# Patient Record
Sex: Female | Born: 1970 | Race: Black or African American | Hispanic: No | Smoking: Former smoker
Health system: Southern US, Community
[De-identification: ages and names within clinical notes are randomized; demographics above are authoritative.]

## PROBLEM LIST (undated history)

## (undated) DIAGNOSIS — C801 Malignant (primary) neoplasm, unspecified: Secondary | ICD-10-CM

## (undated) DIAGNOSIS — R911 Solitary pulmonary nodule: Secondary | ICD-10-CM

## (undated) HISTORY — PX: TUBAL LIGATION: SHX77

## (undated) HISTORY — PX: ABDOMINAL HYSTERECTOMY: SHX81

## (undated) HISTORY — DX: Solitary pulmonary nodule: R91.1

## (undated) HISTORY — PX: OTHER SURGICAL HISTORY: SHX169

---

## 2004-02-28 ENCOUNTER — Emergency Department (HOSPITAL_COMMUNITY): Admission: EM | Admit: 2004-02-28 | Discharge: 2004-02-28 | Payer: Self-pay | Admitting: Emergency Medicine

## 2004-12-19 ENCOUNTER — Emergency Department (HOSPITAL_COMMUNITY): Admission: EM | Admit: 2004-12-19 | Discharge: 2004-12-19 | Payer: Self-pay | Admitting: Emergency Medicine

## 2004-12-20 ENCOUNTER — Emergency Department (HOSPITAL_COMMUNITY): Admission: EM | Admit: 2004-12-20 | Discharge: 2004-12-20 | Payer: Self-pay | Admitting: Emergency Medicine

## 2005-01-26 ENCOUNTER — Emergency Department (HOSPITAL_COMMUNITY): Admission: EM | Admit: 2005-01-26 | Discharge: 2005-01-26 | Payer: Self-pay | Admitting: Family Medicine

## 2005-03-14 ENCOUNTER — Emergency Department (HOSPITAL_COMMUNITY): Admission: EM | Admit: 2005-03-14 | Discharge: 2005-03-14 | Payer: Self-pay | Admitting: Emergency Medicine

## 2005-04-26 ENCOUNTER — Emergency Department (HOSPITAL_COMMUNITY): Admission: EM | Admit: 2005-04-26 | Discharge: 2005-04-26 | Payer: Self-pay | Admitting: Emergency Medicine

## 2005-05-17 ENCOUNTER — Emergency Department (HOSPITAL_COMMUNITY): Admission: EM | Admit: 2005-05-17 | Discharge: 2005-05-17 | Payer: Self-pay | Admitting: Family Medicine

## 2005-11-24 ENCOUNTER — Emergency Department (HOSPITAL_COMMUNITY): Admission: EM | Admit: 2005-11-24 | Discharge: 2005-11-25 | Payer: Self-pay | Admitting: Emergency Medicine

## 2006-02-23 ENCOUNTER — Emergency Department (HOSPITAL_COMMUNITY): Admission: EM | Admit: 2006-02-23 | Discharge: 2006-02-23 | Payer: Self-pay | Admitting: Emergency Medicine

## 2006-04-25 ENCOUNTER — Emergency Department (HOSPITAL_COMMUNITY): Admission: EM | Admit: 2006-04-25 | Discharge: 2006-04-25 | Payer: Self-pay | Admitting: Emergency Medicine

## 2006-05-10 ENCOUNTER — Emergency Department (HOSPITAL_COMMUNITY): Admission: EM | Admit: 2006-05-10 | Discharge: 2006-05-10 | Payer: Self-pay | Admitting: Emergency Medicine

## 2006-08-30 ENCOUNTER — Emergency Department (HOSPITAL_COMMUNITY): Admission: EM | Admit: 2006-08-30 | Discharge: 2006-08-30 | Payer: Self-pay | Admitting: Emergency Medicine

## 2006-12-06 ENCOUNTER — Emergency Department (HOSPITAL_COMMUNITY): Admission: EM | Admit: 2006-12-06 | Discharge: 2006-12-06 | Payer: Self-pay | Admitting: Emergency Medicine

## 2007-05-02 ENCOUNTER — Emergency Department (HOSPITAL_COMMUNITY): Admission: EM | Admit: 2007-05-02 | Discharge: 2007-05-02 | Payer: Self-pay | Admitting: Emergency Medicine

## 2008-06-22 ENCOUNTER — Emergency Department (HOSPITAL_COMMUNITY): Admission: EM | Admit: 2008-06-22 | Discharge: 2008-06-22 | Payer: Self-pay | Admitting: Emergency Medicine

## 2009-06-06 ENCOUNTER — Emergency Department (HOSPITAL_COMMUNITY): Admission: EM | Admit: 2009-06-06 | Discharge: 2009-06-06 | Payer: Self-pay | Admitting: Emergency Medicine

## 2009-07-13 ENCOUNTER — Emergency Department (HOSPITAL_COMMUNITY): Admission: EM | Admit: 2009-07-13 | Discharge: 2009-07-13 | Payer: Self-pay | Admitting: Emergency Medicine

## 2009-08-22 ENCOUNTER — Encounter: Admission: RE | Admit: 2009-08-22 | Discharge: 2009-10-19 | Payer: Self-pay | Admitting: Orthopedic Surgery

## 2010-06-22 ENCOUNTER — Emergency Department (HOSPITAL_COMMUNITY)
Admission: EM | Admit: 2010-06-22 | Discharge: 2010-06-22 | Disposition: A | Discharge status: Home / Self Care | Payer: Self-pay | Attending: Emergency Medicine | Admitting: Emergency Medicine

## 2010-06-22 DIAGNOSIS — K5289 Other specified noninfective gastroenteritis and colitis: Secondary | ICD-10-CM | POA: Insufficient documentation

## 2010-06-22 DIAGNOSIS — R109 Unspecified abdominal pain: Secondary | ICD-10-CM | POA: Insufficient documentation

## 2010-06-22 DIAGNOSIS — K625 Hemorrhage of anus and rectum: Secondary | ICD-10-CM | POA: Insufficient documentation

## 2010-06-22 DIAGNOSIS — J45909 Unspecified asthma, uncomplicated: Secondary | ICD-10-CM | POA: Insufficient documentation

## 2010-06-22 LAB — URINALYSIS, ROUTINE W REFLEX MICROSCOPIC
Bilirubin Urine: NEGATIVE
Hgb urine dipstick: NEGATIVE
Ketones, ur: NEGATIVE mg/dL
Nitrite: NEGATIVE
Protein, ur: NEGATIVE mg/dL
Specific Gravity, Urine: 1.023 (ref 1.005–1.030)
Urine Glucose, Fasting: NEGATIVE mg/dL
Urobilinogen, UA: 0.2 mg/dL (ref 0.0–1.0)
pH: 6 (ref 5.0–8.0)

## 2010-06-22 LAB — CBC
HCT: 37 % (ref 36.0–46.0)
Hemoglobin: 12.4 g/dL (ref 12.0–15.0)
MCH: 31.3 pg (ref 26.0–34.0)
MCHC: 33.5 g/dL (ref 30.0–36.0)
MCV: 93.4 fL (ref 78.0–100.0)
Platelets: 289 10*3/uL (ref 150–400)
RBC: 3.96 MIL/uL (ref 3.87–5.11)
RDW: 12.1 % (ref 11.5–15.5)
WBC: 9.3 10*3/uL (ref 4.0–10.5)

## 2010-06-22 LAB — DIFFERENTIAL
Basophils Absolute: 0 10*3/uL (ref 0.0–0.1)
Basophils Relative: 0 % (ref 0–1)
Eosinophils Absolute: 0.1 10*3/uL (ref 0.0–0.7)
Eosinophils Relative: 1 % (ref 0–5)
Lymphocytes Relative: 27 % (ref 12–46)
Lymphs Abs: 2.5 10*3/uL (ref 0.7–4.0)
Monocytes Absolute: 0.7 10*3/uL (ref 0.1–1.0)
Monocytes Relative: 8 % (ref 3–12)
Neutro Abs: 6 10*3/uL (ref 1.7–7.7)
Neutrophils Relative %: 65 % (ref 43–77)

## 2010-06-22 LAB — POCT I-STAT, CHEM 8
BUN: 9 mg/dL (ref 6–23)
Calcium, Ion: 1.08 mmol/L — ABNORMAL LOW (ref 1.12–1.32)
Chloride: 101 mEq/L (ref 96–112)
Creatinine, Ser: 0.7 mg/dL (ref 0.4–1.2)
Glucose, Bld: 88 mg/dL (ref 70–99)
HCT: 39 % (ref 36.0–46.0)
Hemoglobin: 13.3 g/dL (ref 12.0–15.0)
Potassium: 3.5 mEq/L (ref 3.5–5.1)
Sodium: 138 mEq/L (ref 135–145)
TCO2: 24 mmol/L (ref 0–100)

## 2010-06-22 LAB — PREGNANCY, URINE: Preg Test, Ur: NEGATIVE

## 2010-08-01 ENCOUNTER — Encounter: Payer: Self-pay | Admitting: Gastroenterology

## 2010-08-15 LAB — POCT CARDIAC MARKERS
CKMB, poc: <1 ng/mL — ABNORMAL LOW (ref 1.0–8.0)
CKMB, poc: <1 ng/mL — ABNORMAL LOW (ref 1.0–8.0)
Myoglobin, poc: 45.9 ng/mL (ref 12–200)
Myoglobin, poc: 82.9 ng/mL (ref 12–200)
Troponin i, poc: <0.05 ng/mL (ref 0.00–0.09)
Troponin i, poc: <0.05 ng/mL (ref 0.00–0.09)

## 2010-08-15 LAB — DIFFERENTIAL
Basophils Absolute: 0 10*3/uL (ref 0.0–0.1)
Basophils Relative: 0 % (ref 0–1)
Eosinophils Absolute: 0.1 10*3/uL (ref 0.0–0.7)
Eosinophils Relative: 2 % (ref 0–5)
Lymphocytes Relative: 28 % (ref 12–46)
Lymphs Abs: 2.1 10*3/uL (ref 0.7–4.0)
Monocytes Absolute: 0.4 10*3/uL (ref 0.1–1.0)
Monocytes Relative: 6 % (ref 3–12)
Neutro Abs: 4.8 10*3/uL (ref 1.7–7.7)
Neutrophils Relative %: 64 % (ref 43–77)

## 2010-08-15 LAB — CBC
HCT: 40.1 % (ref 36.0–46.0)
Hemoglobin: 13.7 g/dL (ref 12.0–15.0)
MCHC: 34.1 g/dL (ref 30.0–36.0)
MCV: 95.9 fL (ref 78.0–100.0)
Platelets: 286 10*3/uL (ref 150–400)
RBC: 4.18 MIL/uL (ref 3.87–5.11)
RDW: 13.3 % (ref 11.5–15.5)
WBC: 7.5 10*3/uL (ref 4.0–10.5)

## 2010-08-15 LAB — BASIC METABOLIC PANEL
BUN: 14 mg/dL (ref 6–23)
CO2: 23 mEq/L (ref 19–32)
Calcium: 9 mg/dL (ref 8.4–10.5)
Chloride: 103 mEq/L (ref 96–112)
Creatinine, Ser: 0.63 mg/dL (ref 0.4–1.2)
GFR calc Af Amer: >60 mL/min (ref >60)
GFR calc non Af Amer: >60 mL/min (ref >60)
Glucose, Bld: 77 mg/dL (ref 70–99)
Potassium: 4.3 mEq/L (ref 3.5–5.1)
Sodium: 136 mEq/L (ref 135–145)

## 2010-11-07 NOTE — Patient Instructions (Signed)
Tammy Jacobs, CMA More Detail >>      Tammy Jacobs, [place]        Sent: Tue August 01, 2010 11:10 AM    To: Terea Neubauer    MRN: [id number] DOB: 1970-11-08     Pt Home: [phone]               Message     Please bill patient for her no show today. Per Dr Jarold Motto     Patient BILLED. $50.00./yf

## 2010-11-08 ENCOUNTER — Telehealth: Payer: Self-pay | Admitting: Gastroenterology

## 2010-11-08 NOTE — Telephone Encounter (Signed)
Message copied by Arna Snipe on Wed Nov 08, 2010 10:06 AM ------      Message from: Harlow Mares D      Created: Tue Aug 01, 2010 11:10 AM       Please bill patient for her no show today. Per Dr Jarold Motto

## 2010-11-08 NOTE — Progress Notes (Signed)
This encounter was created in error - please disregard.

## 2011-04-05 ENCOUNTER — Emergency Department (HOSPITAL_COMMUNITY)
Admission: EM | Admit: 2011-04-05 | Discharge: 2011-04-06 | Disposition: A | Discharge status: Home / Self Care | Payer: Self-pay | Attending: Emergency Medicine | Admitting: Emergency Medicine

## 2011-04-05 ENCOUNTER — Other Ambulatory Visit: Payer: Self-pay

## 2011-04-05 ENCOUNTER — Encounter: Payer: Self-pay | Admitting: Emergency Medicine

## 2011-04-05 ENCOUNTER — Emergency Department (HOSPITAL_COMMUNITY): Payer: Self-pay

## 2011-04-05 DIAGNOSIS — R51 Headache: Secondary | ICD-10-CM | POA: Insufficient documentation

## 2011-04-05 DIAGNOSIS — R079 Chest pain, unspecified: Secondary | ICD-10-CM | POA: Insufficient documentation

## 2011-04-05 LAB — DIFFERENTIAL
Lymphocytes Relative: 38 % (ref 12–46)
Lymphs Abs: 3 10*3/uL (ref 0.7–4.0)
Monocytes Relative: 7 % (ref 3–12)
Neutro Abs: 4.2 10*3/uL (ref 1.7–7.7)
Neutrophils Relative %: 53 % (ref 43–77)

## 2011-04-05 LAB — CBC
Hemoglobin: 12.5 g/dL (ref 12.0–15.0)
Platelets: 243 10*3/uL (ref 150–400)
RBC: 3.85 MIL/uL — ABNORMAL LOW (ref 3.87–5.11)
WBC: 7.9 10*3/uL (ref 4.0–10.5)

## 2011-04-05 NOTE — ED Notes (Signed)
Pt alert, nad, c/o left neck and chest pain, onset was today, pt states she has had similar pains in the past, pain starts in neck, radiates to chest, resp even unlabored, skin bwd, denies n/v

## 2011-04-05 NOTE — ED Provider Notes (Signed)
History     CSN: [id number] Arrival date & time: 04/05/2011  7:09 PM   First MD Initiated Contact with Patient 04/05/11 2214      Chief Complaint  Patient presents with  . Chest Pain    HPI  History provided by the patient. Patient with history of asthma presents with complaints of gradually increasing left neck pain radiating down to left chest area throughout the day. Patient reports symptoms began with mild headache yesterday. This morning she woke up with posterior lateral left neck pain described as a pressure. Patient went to her classes at school and reports having increasing symptoms with radiation to her left chest area. Patient has associated tightness in chest with shortness of breath at times. He didn't seem increased with a deep breath. Patient denies any cough, fever, chills, sweats. She has no other significant past medical history.   Past Medical History  Diagnosis Date  . Asthma     Past Surgical History  Procedure Date  . Tubal ligation   . Cesarean section     History reviewed. No pertinent family history.  History  Substance Use Topics  . Smoking status: Current Some Day Smoker    Types: Cigarettes  . Smokeless tobacco: Not on file  . Alcohol Use:     OB History    Grav Para Term Preterm Abortions TAB SAB Ect Mult Living                  Review of Systems  Constitutional: Negative for fever, chills and diaphoresis.  Respiratory: Negative for cough, shortness of breath and wheezing.   Cardiovascular: Positive for chest pain.  Gastrointestinal: Negative for nausea, vomiting, abdominal pain, diarrhea and constipation.  Neurological: Positive for headaches. Negative for speech difficulty, weakness and numbness.  All other systems reviewed and are negative.    Allergies  Aleve; Hydrocodone; and Percocet  Home Medications  No current outpatient prescriptions on file.  BP 119/68  Pulse 94  Temp(Src) 98.2 F (36.8 C) (Oral)  Resp 18  Ht 5'  (1.524 m)  Wt 160 lb (72.576 kg)  BMI 31.25 kg/m2  SpO2 99%  LMP 03/22/2011  Physical Exam  Nursing note and vitals reviewed. Constitutional: She is oriented to person, place, and time. She appears well-developed and well-nourished. No distress.  HENT:  Head: Normocephalic.  Mouth/Throat: Oropharynx is clear and moist.  Eyes: Conjunctivae and EOM are normal. Pupils are equal, round, and reactive to light.  Cardiovascular: Normal rate, regular rhythm and normal heart sounds.   Pulmonary/Chest: Effort normal and breath sounds normal. No respiratory distress. She has no wheezes. She has no rales. She exhibits no tenderness.  Abdominal: Soft. Bowel sounds are normal. There is no tenderness. There is no rebound and no guarding.  Musculoskeletal: She exhibits no edema and no tenderness.  Neurological: She is alert and oriented to person, place, and time.  Skin: Skin is warm. No rash noted.  Psychiatric: She has a normal mood and affect. Her behavior is normal.    ED Course  Procedures (including critical care time)  Labs Reviewed  CBC - Abnormal; Notable for the following:    RBC 3.85 (*)    All other components within normal limits  DIFFERENTIAL  I-STAT TROPONIN I  BASIC METABOLIC PANEL   Results for orders placed during the hospital encounter of 04/05/11  CBC      Component Value Range   WBC 7.9  4.0 - 10.5 (K/uL)   RBC 3.85 (*)  3.87 - 5.11 (MIL/uL)   Hemoglobin 12.5  12.0 - 15.0 (g/dL)   HCT 78.2  95.6 - 21.3 (%)   MCV 94.0  78.0 - 100.0 (fL)   MCH 32.5  26.0 - 34.0 (pg)   MCHC 34.5  30.0 - 36.0 (g/dL)   RDW 08.6  57.8 - 46.9 (%)   Platelets 243  150 - 400 (K/uL)  DIFFERENTIAL      Component Value Range   Neutrophils Relative 53  43 - 77 (%)   Neutro Abs 4.2  1.7 - 7.7 (K/uL)   Lymphocytes Relative 38  12 - 46 (%)   Lymphs Abs 3.0  0.7 - 4.0 (K/uL)   Monocytes Relative 7  3 - 12 (%)   Monocytes Absolute 0.5  0.1 - 1.0 (K/uL)   Eosinophils Relative 2  0 - 5 (%)    Eosinophils Absolute 0.2  0.0 - 0.7 (K/uL)   Basophils Relative 0  0 - 1 (%)   Basophils Absolute 0.0  0.0 - 0.1 (K/uL)  BASIC METABOLIC PANEL      Component Value Range   Sodium 134 (*) 135 - 145 (mEq/L)   Potassium 3.8  3.5 - 5.1 (mEq/L)   Chloride 102  96 - 112 (mEq/L)   CO2 21  19 - 32 (mEq/L)   Glucose, Bld 76  70 - 99 (mg/dL)   BUN 15  6 - 23 (mg/dL)   Creatinine, Ser 6.29  0.50 - 1.10 (mg/dL)   Calcium 9.0  8.4 - 52.8 (mg/dL)   GFR calc non Af Amer >90  >90 (mL/min)   GFR calc Af Amer >90  >90 (mL/min)  TROPONIN I      Component Value Range   Troponin I <0.30  <0.30 (ng/mL)     Dg Chest 2 View  04/06/2011  *RADIOLOGY REPORT*  Clinical Data: Chest pain.  CHEST - 2 VIEW  Comparison: Chest radiograph performed 06/22/2008  Findings: The lungs are well-aerated and clear.  There is no evidence of focal opacification, pleural effusion or pneumothorax.  The heart is normal in size; the mediastinal contour is within normal limits.  No acute osseous abnormalities are seen.  IMPRESSION: No acute cardiopulmonary process seen.  Original Report Authenticated By: Tonia Ghent, M.D.     1. Chest pain      MDM  10:45PM issues seen and evaluated. Patient no acute distress. Pt is PERC negative.  Patient with normal ECG, chest x-ray, cardiac markers, lab tests. The patient is low risk for ACS with no history of hypertension, diabetes, elevated cholesterol. Patient's history is atypical. At this time it is felt patient is safe to return home and followup with the primary care provider.     Date: 04/05/2011  Rate: 85  Rhythm: normal sinus rhythm  QRS Axis: normal  Intervals: normal  ST/T Wave abnormalities: normal  Conduction Disutrbances:none  Narrative Interpretation:   Old EKG Reviewed: unchanged from 06/22/2008    Angus Seller, PA 04/06/11 [phone]

## 2011-04-05 NOTE — ED Notes (Signed)
MD at bedside. 

## 2011-04-06 LAB — BASIC METABOLIC PANEL
BUN: 15 mg/dL (ref 6–23)
Chloride: 102 mEq/L (ref 96–112)
Glucose, Bld: 76 mg/dL (ref 70–99)
Potassium: 3.8 mEq/L (ref 3.5–5.1)
Sodium: 134 mEq/L — ABNORMAL LOW (ref 135–145)

## 2011-04-09 NOTE — ED Provider Notes (Signed)
Medical screening examination/treatment/procedure(s) were performed by non-physician practitioner and as supervising physician I was immediately available for consultation/collaboration.   Ryden Wainer A. Patrica Duel, MD 04/11/11 2300

## 2011-10-18 ENCOUNTER — Encounter (HOSPITAL_COMMUNITY): Payer: Self-pay | Admitting: Emergency Medicine

## 2011-10-18 ENCOUNTER — Emergency Department (HOSPITAL_COMMUNITY)
Admission: EM | Admit: 2011-10-18 | Discharge: 2011-10-18 | Disposition: A | Discharge status: Home / Self Care | Payer: Self-pay | Attending: Emergency Medicine | Admitting: Emergency Medicine

## 2011-10-18 DIAGNOSIS — F17200 Nicotine dependence, unspecified, uncomplicated: Secondary | ICD-10-CM | POA: Insufficient documentation

## 2011-10-18 DIAGNOSIS — M25511 Pain in right shoulder: Secondary | ICD-10-CM

## 2011-10-18 DIAGNOSIS — J45909 Unspecified asthma, uncomplicated: Secondary | ICD-10-CM | POA: Insufficient documentation

## 2011-10-18 DIAGNOSIS — M25519 Pain in unspecified shoulder: Secondary | ICD-10-CM | POA: Insufficient documentation

## 2011-10-18 NOTE — ED Provider Notes (Signed)
History     CSN: [id number]  Arrival date & time 10/18/11  6962   First MD Initiated Contact with Patient 10/18/11 1856      Chief Complaint  Patient presents with  . Shoulder Pain    (Consider location/radiation/quality/duration/timing/severity/associated sxs/prior treatment) HPI  41 year old female presents complaining of right shoulder pain. Patient states for the past month she has had gradual onset of pain to the back of her right shoulder which radiates up to her mid back. Pain is described as a sharp sensation worsening with right arm movement. She noticed increasing pain whenever she twists her arm or when she raises her R arm.  Pain is affecting her daily living because she can't move the arm effectively.  She denies fever, chills, cough, chest pain, shortness of breath, numbness, weakness, rash, recent trauma. She is right-handed. She has tried over-the-counter ibuprofen without relief. She has tried heat and ice without relief.  Past Medical History  Diagnosis Date  . Asthma     Past Surgical History  Procedure Date  . Tubal ligation   . Cesarean section     No family history on file.  History  Substance Use Topics  . Smoking status: Current Some Day Smoker    Types: Cigarettes  . Smokeless tobacco: Not on file  . Alcohol Use:     OB History    Grav Para Term Preterm Abortions TAB SAB Ect Mult Living                  Review of Systems  Constitutional: Negative for fever and fatigue.  Respiratory: Negative for chest tightness and shortness of breath.   Musculoskeletal: Negative for back pain.  Skin: Negative for rash.  Neurological: Negative for numbness.    Allergies  Hydrocodone; Naproxen sodium; and Percocet  Home Medications  No current outpatient prescriptions on file.  There were no vitals taken for this visit.  Physical Exam  Nursing note and vitals reviewed. Constitutional: She appears well-developed and well-nourished. No distress.    HENT:  Head: Atraumatic.  Neck: Normal range of motion. Neck supple.  Cardiovascular: Normal rate and regular rhythm.   Pulmonary/Chest: Effort normal. No respiratory distress. She has no wheezes. She exhibits no tenderness.  Musculoskeletal: She exhibits no edema.       Right shoulder: She exhibits decreased range of motion, tenderness and pain. She exhibits no bony tenderness, no swelling, no deformity and no laceration.       Cervical back: Normal.       Back:       R shoulder: tenderness along infrascapular region on palpation without overlying skin changes.  R shoulder with FROM with assist.  Increasing pain with shoulder rotation and with shoulder abduction.  Sensation intact to light touch throughout.  Normal grip strength.    Neurological: She is alert.  Skin: Skin is warm. No rash noted.    ED Course  Procedures (including critical care time)  Labs Reviewed - No data to display No results found.   No diagnosis found.    MDM  R shoulder pain x 1 month.  Suspicious for rotator cuff tear vs muscle strain.  Referall given.  Doubt bony etiology.  Chest CTAB.          Fayrene Helper, PA-C 10/18/11 2040

## 2011-10-18 NOTE — ED Notes (Signed)
Patient states that she is having right shoulder pain. Tightness to her back

## 2011-10-18 NOTE — ED Provider Notes (Signed)
Medical screening examination/treatment/procedure(s) were performed by non-physician practitioner and as supervising physician I was immediately available for consultation/collaboration.  Juliet Rude. Rubin Payor, MD 10/18/11 [phone]

## 2011-10-18 NOTE — Discharge Instructions (Signed)
Please follow up with Dr. Montez Morita for further evaluation.  You can continue taking over the counter pain medication in the mean time.  Return if you are having fever, rash, shortness of breath or if you have other concerns.    Shoulder Pain The shoulder is a ball and socket joint. The muscles and tendons (rotator cuff) are what keep the shoulder in its joint and stable. This collection of muscles and tendons holds in the head (ball) of the humerus (upper arm bone) in the fossa (cup) of the scapula (shoulder blade). Today no reason was found for your shoulder pain. Often pain in the shoulder may be treated conservatively with temporary immobilization. For example, holding the shoulder in one place using a sling for rest. Physical therapy may be needed if problems continue. HOME CARE INSTRUCTIONS   Apply ice to the sore area for 15 to 20 minutes, 3 to 4 times per day for the first 2 days. Put the ice in a plastic bag. Place a towel between the bag of ice and your skin.   If you have or were given a shoulder sling and straps, do not remove for as long as directed by your caregiver or until you see a caregiver for a follow-up examination. If you need to remove it to shower or bathe, move your arm as little as possible.   Sleep on several pillows at night to lessen swelling and pain.   Only take over-the-counter or prescription medicines for pain, discomfort, or fever as directed by your caregiver.   Keep any follow-up appointments in order to avoid any type of permanent shoulder disability or chronic pain problems.  SEEK MEDICAL CARE IF:   Pain in your shoulder increases or new pain develops in your arm, hand, or fingers.   Your hand or fingers are colder than your other hand.   You do not obtain pain relief with the medications or your pain becomes worse.  SEEK IMMEDIATE MEDICAL CARE IF:   Your arm, hand, or fingers are numb or tingling.   Your arm, hand, or fingers are swollen, painful, or  turn white or blue.   You develop chest pain or shortness of breath.  MAKE SURE YOU:   Understand these instructions.   Will watch your condition.   Will get help right away if you are not doing well or get worse.  Document Released: 01/24/2005 Document Revised: 04/05/2011 Document Reviewed: 03/31/2011 [facility] Patient Information 2012 [place], [place].  Rotator Cuff Injury The rotator cuff is the collective set of muscles and tendons that make up the stabilizing unit of your shoulder. This unit holds in the ball of the humerus (upper arm bone) in the socket of the scapula (shoulder blade). Injuries to this stabilizing unit most commonly come from sports or activities that cause the arm to be moved repeatedly over the head. Examples of this include throwing, weight lifting, swimming, racquet sports, or an injury such as falling on your arm. Chronic (longstanding) irritation of this unit can cause inflammation (soreness), bursitis, and eventual damage to the tendons to the point of rupture (tear). An acute (sudden) injury of the rotator cuff can result in a partial or complete tear. You may need surgery with complete tears. Small or partial rotator cuff tears may be treated conservatively with temporary immobilization, exercises and rest. Physical therapy may be needed. HOME CARE INSTRUCTIONS   Apply ice to the injury for 15 to 20 minutes 3 to 4 times per day for the  first 2 days. Put the ice in a plastic bag and place a towel between the bag of ice and your skin.   If you have a shoulder immobilizer (sling and straps), do not remove it for as long as directed by your caregiver or until you see a caregiver for a follow-up examination. If you need to remove it, move your arm as little as possible.   You may want to sleep on several pillows or in a recliner at night to lessen swelling and pain.   Only take over-the-counter or prescription medicines for pain, discomfort, or fever as directed by  your caregiver.   Do simple hand squeezing exercises with a soft rubber ball to decrease hand swelling.  SEEK MEDICAL CARE IF:   Pain in your shoulder increases or new pain or numbness develops in your arm, hand, or fingers.   Your hand or fingers are colder than your other hand.  SEEK IMMEDIATE MEDICAL CARE IF:   Your arm, hand, or fingers are numb or tingling.   Your arm, hand, or fingers are increasingly swollen and painful, or turn white or blue.  Document Released: 04/13/2000 Document Revised: 04/05/2011 Document Reviewed: 04/06/2008 [facility] Patient Information 2012 [place], [place].

## 2012-02-01 ENCOUNTER — Encounter (HOSPITAL_COMMUNITY): Payer: Self-pay

## 2012-02-01 ENCOUNTER — Emergency Department (HOSPITAL_COMMUNITY): Payer: PRIVATE HEALTH INSURANCE

## 2012-02-01 ENCOUNTER — Emergency Department (HOSPITAL_COMMUNITY)
Admission: EM | Admit: 2012-02-01 | Discharge: 2012-02-01 | Disposition: A | Discharge status: Home / Self Care | Payer: PRIVATE HEALTH INSURANCE | Attending: Emergency Medicine | Admitting: Emergency Medicine

## 2012-02-01 DIAGNOSIS — R059 Cough, unspecified: Secondary | ICD-10-CM | POA: Insufficient documentation

## 2012-02-01 DIAGNOSIS — IMO0001 Reserved for inherently not codable concepts without codable children: Secondary | ICD-10-CM | POA: Insufficient documentation

## 2012-02-01 DIAGNOSIS — R05 Cough: Secondary | ICD-10-CM | POA: Insufficient documentation

## 2012-02-01 DIAGNOSIS — R110 Nausea: Secondary | ICD-10-CM | POA: Insufficient documentation

## 2012-02-01 DIAGNOSIS — R220 Localized swelling, mass and lump, head: Secondary | ICD-10-CM | POA: Insufficient documentation

## 2012-02-01 DIAGNOSIS — R002 Palpitations: Secondary | ICD-10-CM | POA: Insufficient documentation

## 2012-02-01 DIAGNOSIS — R071 Chest pain on breathing: Secondary | ICD-10-CM | POA: Insufficient documentation

## 2012-02-01 DIAGNOSIS — R5383 Other fatigue: Secondary | ICD-10-CM | POA: Insufficient documentation

## 2012-02-01 DIAGNOSIS — R5381 Other malaise: Secondary | ICD-10-CM | POA: Insufficient documentation

## 2012-02-01 DIAGNOSIS — R51 Headache: Secondary | ICD-10-CM | POA: Insufficient documentation

## 2012-02-01 DIAGNOSIS — B9789 Other viral agents as the cause of diseases classified elsewhere: Secondary | ICD-10-CM | POA: Insufficient documentation

## 2012-02-01 DIAGNOSIS — F17200 Nicotine dependence, unspecified, uncomplicated: Secondary | ICD-10-CM | POA: Insufficient documentation

## 2012-02-01 DIAGNOSIS — B349 Viral infection, unspecified: Secondary | ICD-10-CM

## 2012-02-01 DIAGNOSIS — R630 Anorexia: Secondary | ICD-10-CM | POA: Insufficient documentation

## 2012-02-01 DIAGNOSIS — J45909 Unspecified asthma, uncomplicated: Secondary | ICD-10-CM | POA: Insufficient documentation

## 2012-02-01 LAB — CBC WITH DIFFERENTIAL/PLATELET
Eosinophils Absolute: 0.1 10*3/uL (ref 0.0–0.7)
Eosinophils Relative: 1 % (ref 0–5)
Hemoglobin: 11.6 g/dL — ABNORMAL LOW (ref 12.0–15.0)
Lymphocytes Relative: 34 % (ref 12–46)
Lymphs Abs: 1.9 10*3/uL (ref 0.7–4.0)
MCH: 32.3 pg (ref 26.0–34.0)
MCV: 91.9 fL (ref 78.0–100.0)
Monocytes Relative: 7 % (ref 3–12)
Neutrophils Relative %: 58 % (ref 43–77)
Platelets: 269 10*3/uL (ref 150–400)
RBC: 3.59 MIL/uL — ABNORMAL LOW (ref 3.87–5.11)
WBC: 5.6 10*3/uL (ref 4.0–10.5)

## 2012-02-01 LAB — BASIC METABOLIC PANEL
BUN: 14 mg/dL (ref 6–23)
CO2: 24 mEq/L (ref 19–32)
GFR calc non Af Amer: >90 mL/min (ref >90)
Glucose, Bld: 100 mg/dL — ABNORMAL HIGH (ref 70–99)
Potassium: 4.1 mEq/L (ref 3.5–5.1)
Sodium: 136 mEq/L (ref 135–145)

## 2012-02-01 LAB — POCT I-STAT TROPONIN I

## 2012-02-01 MED ORDER — ASPIRIN 81 MG PO CHEW
324.0000 mg | CHEWABLE_TABLET | Freq: Once | ORAL | Status: AC
  Administered 2012-02-01: 324 mg via ORAL
  Filled 2012-02-01: qty 4

## 2012-02-01 NOTE — ED Provider Notes (Signed)
History     CSN: [id number]  Arrival date & time 02/01/12  1457   First MD Initiated Contact with Patient 02/01/12 1633      Chief Complaint  Patient presents with  . Chest Pressure     (Consider location/radiation/quality/duration/timing/severity/associated sxs/prior treatment) HPI Comments: Patient reports that she has had pain in her chest for the past week.  The pain has been constant.  However, at times the pain becomes better and at times the pain worsens.  Pain not associated with exertion.  She states that the pain worsens at night when she lays down to sleep.  Chest pain is not pleuritic.  Pain located across her anterior chest.  Pain does not radiate.  She has had an occasional cough, no hemoptysis.  Pain is not associated with SOB, nausea, vomiting, numbness, dizziness, syncope, or diaphoresis.  No prior history of cardiac disease.  No history of HTN, hyperlipidemia, or DM.  No family history of cardiac disease.  She states that she has also had a headache for the past 2 days, myalgias, decreased appetite, and fatigue.  She denies fever or chills.  Denies rash.  Denies neck pain or stiffness.  No confusion.  She denies any surgery in the past 4 weeks.  Denies prolonged travel in the past 4 weeks.  No prior history of DVT or PE.  No lower extremity swelling or pain.  No prior history of cancer.  She is not on any estrogen containing medication.    The history is provided by the patient.    Past Medical History  Diagnosis Date  . Asthma     Past Surgical History  Procedure Date  . Tubal ligation   . Cesarean section     No family history on file.  History  Substance Use Topics  . Smoking status: Current Some Day Smoker    Types: Cigarettes  . Smokeless tobacco: Not on file  . Alcohol Use: Yes     ocasionally    OB History    Grav Para Term Preterm Abortions TAB SAB Ect Mult Living                  Review of Systems  Constitutional: Positive for appetite  change and fatigue. Negative for fever and chills.  HENT: Negative for ear pain, congestion, sore throat, rhinorrhea, neck pain, neck stiffness and sinus pressure.   Eyes: Negative for visual disturbance.  Respiratory: Negative for shortness of breath.   Cardiovascular: Positive for chest pain and palpitations.  Gastrointestinal: Positive for nausea. Negative for vomiting and abdominal pain.  Musculoskeletal: Positive for myalgias.  Neurological: Positive for headaches. Negative for dizziness, syncope, light-headedness and numbness.  Psychiatric/Behavioral: Negative for confusion.    Allergies  Hydrocodone; Naproxen sodium; Percocet; and Zolpidem  Home Medications   Current Outpatient Rx  Name Route Sig Dispense Refill  . OVER THE COUNTER MEDICATION Oral Take 1 tablet by mouth at bedtime as needed. 'Pain reliever PM tablet.' For sleep and pain.      BP 117/75  Pulse 83  Temp 97.9 F (36.6 C) (Oral)  Resp 16  SpO2 99%  LMP 01/19/2012  Physical Exam  Nursing note and vitals reviewed. Constitutional: She appears well-developed and well-nourished. No distress.  HENT:  Head: Normocephalic and atraumatic.  Right Ear: Tympanic membrane and ear canal normal.  Left Ear: Tympanic membrane and ear canal normal.  Nose: Mucosal edema present.  Mouth/Throat: Uvula is midline, oropharynx is clear and moist and mucous  membranes are normal.  Eyes: EOM are normal. Pupils are equal, round, and reactive to light.  Neck: Normal range of motion. Neck supple.  Cardiovascular: Normal rate, regular rhythm and normal heart sounds.   Pulses:      Dorsalis pedis pulses are 2+ on the right side, and 2+ on the left side.  Pulmonary/Chest: Effort normal and breath sounds normal. No respiratory distress. She has no wheezes. She has no rales. She exhibits no tenderness.  Abdominal: Soft. There is no tenderness.  Musculoskeletal: Normal range of motion.       No LE edema  Neurological: She is alert. She  has normal strength. No cranial nerve deficit or sensory deficit. Gait normal.  Skin: Skin is warm and dry. She is not diaphoretic.  Psychiatric: She has a normal mood and affect.    ED Course  Procedures (including critical care time)  Labs Reviewed - No data to display Dg Chest 2 View  02/01/2012  *RADIOLOGY REPORT*  Clinical Data: Left-sided chest pain and shortness of breath.  CHEST - 2 VIEW  Comparison: 04/05/2011.  Findings: The cardiac silhouette, mediastinal and hilar contours are normal and stable.  The lungs are clear.  No pleural effusion. The bony thorax is intact.  IMPRESSION: Normal chest x-ray.   Original Report Authenticated By: P. Loralie Champagne, M.D.      No diagnosis found.   Date: 02/01/2012  Rate: 82  Rhythm: normal sinus rhythm  QRS Axis: normal  Intervals: normal  ST/T Wave abnormalities: normal  Conduction Disutrbances:none  Narrative Interpretation:   Old EKG Reviewed: unchanged    MDM  Patient is to be discharged with recommendation to follow up with PCP in regards to today's hospital visit. Chest pain present for one week.  Pain associated with headache, fatigue, myalgias, decreased appetite, and cough.  Chest pain most likely associated with viral illness.  Chest pain is not likely of cardiac or pulmonary etiology d/t presentation, perc negative, VSS, no tracheal deviation, no JVD or new murmur, RRR, breath sounds equal bilaterally, EKG without acute abnormalities, negative troponin, and negative CXR.  Return precautions discussed with patient. Pt appears reliable for follow up and is agreeable to discharge.          Pascal Lux [place], PA-C 02/02/12 1102

## 2012-02-01 NOTE — ED Notes (Signed)
Pt had headache 2 days along with chest pressure and fluttering feeling in her chest, with slight SOB, nausea no vomiting. sts pressure at times go to her neck. Sinus monitor noted. EKG done. Protocol implemented.

## 2012-02-02 NOTE — ED Provider Notes (Signed)
Medical screening examination/treatment/procedure(s) were performed by non-physician practitioner and as supervising physician I was immediately available for consultation/collaboration.   Richardean Canal, MD 02/02/12 [phone]

## 2012-06-20 ENCOUNTER — Emergency Department (HOSPITAL_COMMUNITY)
Admission: EM | Admit: 2012-06-20 | Discharge: 2012-06-20 | Disposition: A | Discharge status: Home / Self Care | Payer: Self-pay | Attending: Emergency Medicine | Admitting: Emergency Medicine

## 2012-06-20 ENCOUNTER — Emergency Department (HOSPITAL_COMMUNITY): Payer: Self-pay

## 2012-06-20 ENCOUNTER — Encounter (HOSPITAL_COMMUNITY): Payer: Self-pay

## 2012-06-20 DIAGNOSIS — R197 Diarrhea, unspecified: Secondary | ICD-10-CM | POA: Insufficient documentation

## 2012-06-20 DIAGNOSIS — M549 Dorsalgia, unspecified: Secondary | ICD-10-CM | POA: Insufficient documentation

## 2012-06-20 DIAGNOSIS — R079 Chest pain, unspecified: Secondary | ICD-10-CM

## 2012-06-20 DIAGNOSIS — M7918 Myalgia, other site: Secondary | ICD-10-CM

## 2012-06-20 DIAGNOSIS — J45909 Unspecified asthma, uncomplicated: Secondary | ICD-10-CM | POA: Insufficient documentation

## 2012-06-20 DIAGNOSIS — F17200 Nicotine dependence, unspecified, uncomplicated: Secondary | ICD-10-CM | POA: Insufficient documentation

## 2012-06-20 LAB — CBC WITH DIFFERENTIAL/PLATELET
Basophils Absolute: 0 10*3/uL (ref 0.0–0.1)
Eosinophils Relative: 2 % (ref 0–5)
HCT: 35.3 % — ABNORMAL LOW (ref 36.0–46.0)
Hemoglobin: 12.4 g/dL (ref 12.0–15.0)
Lymphocytes Relative: 36 % (ref 12–46)
Lymphs Abs: 2.7 10*3/uL (ref 0.7–4.0)
MCV: 91.7 fL (ref 78.0–100.0)
Monocytes Absolute: 0.5 10*3/uL (ref 0.1–1.0)
Monocytes Relative: 7 % (ref 3–12)
Neutro Abs: 4.2 10*3/uL (ref 1.7–7.7)
RBC: 3.85 MIL/uL — ABNORMAL LOW (ref 3.87–5.11)
WBC: 7.5 10*3/uL (ref 4.0–10.5)

## 2012-06-20 LAB — HEPATIC FUNCTION PANEL
ALT: 14 U/L (ref 0–35)
Bilirubin, Direct: <0.1 mg/dL (ref 0.0–0.3)

## 2012-06-20 LAB — LIPASE, BLOOD: Lipase: 34 U/L (ref 11–59)

## 2012-06-20 LAB — COMPREHENSIVE METABOLIC PANEL
AST: 25 U/L (ref 0–37)
BUN: 14 mg/dL (ref 6–23)
CO2: 25 mEq/L (ref 19–32)
Calcium: 8.4 mg/dL (ref 8.4–10.5)
Chloride: 102 mEq/L (ref 96–112)
Creatinine, Ser: 0.58 mg/dL (ref 0.50–1.10)
GFR calc Af Amer: >90 mL/min (ref >90)
GFR calc non Af Amer: >90 mL/min (ref >90)
Glucose, Bld: 93 mg/dL (ref 70–99)
Total Bilirubin: 0.3 mg/dL (ref 0.3–1.2)

## 2012-06-20 MED ORDER — IBUPROFEN 800 MG PO TABS
800.0000 mg | ORAL_TABLET | Freq: Once | ORAL | Status: AC
  Administered 2012-06-20: 800 mg via ORAL
  Filled 2012-06-20: qty 1

## 2012-06-20 MED ORDER — HYDROCODONE-ACETAMINOPHEN 5-325 MG PO TABS
2.0000 | ORAL_TABLET | Freq: Once | ORAL | Status: AC
  Administered 2012-06-20: 2 via ORAL
  Filled 2012-06-20: qty 2

## 2012-06-20 MED ORDER — HYDROCODONE-ACETAMINOPHEN 5-325 MG PO TABS: 1.0000 | ORAL_TABLET | Freq: Four times a day (QID) | ORAL | Status: DC | PRN

## 2012-06-20 MED ORDER — GI COCKTAIL ~~LOC~~
30.0000 mL | Freq: Once | ORAL | Status: AC
  Administered 2012-06-20: 30 mL via ORAL
  Filled 2012-06-20: qty 30

## 2012-06-20 NOTE — ED Notes (Signed)
Pt reports chest pain hurts more when she breathes in. Pt reports last breathing treatment was 1-2 years prior. Pt reports seasonal asthma.

## 2012-06-20 NOTE — ED Notes (Signed)
Pt complains of generalized chest pain that radiates to her back and left arm pain for two days

## 2012-06-20 NOTE — ED Provider Notes (Signed)
History     CSN: [id number]  Arrival date & time 06/20/12  0152   First MD Initiated Contact with Patient 06/20/12 0211      Chief Complaint  Patient presents with  . Chest Pain  . Back Pain    (Consider location/radiation/quality/duration/timing/severity/associated sxs/prior treatment) HPI QUIANA COBAUGH is a 42 y.o. female w/ PMH of asthma.  Pt has had chest pain for the last 2-3 days, it is located in the center of the chest and extends under the left breast, it is a linger, dull pain, moderate to severe, radiates to the back.  Pt vomited yesterday, but no vomiting, abdominal pain, fever or chills today.  She did have 4 episodes of watery stools over the last 12 hours.  No productive cough, no h/o HTN, HLD, DM, she is a current smoker.  She denies dyspnea, hemoptysis, long periods of immobilization, exogenous estrogen, cancer treatment.  Chest pain is not associated with any other symptoms.    Past Medical History  Diagnosis Date  . Asthma     Past Surgical History  Procedure Laterality Date  . Tubal ligation    . Cesarean section      History reviewed. No pertinent family history.  History  Substance Use Topics  . Smoking status: Current Some Day Smoker    Types: Cigarettes  . Smokeless tobacco: Not on file  . Alcohol Use: Yes     Comment: ocasionally    OB History   Grav Para Term Preterm Abortions TAB SAB Ect Mult Living                  Review of Systems At least 10pt or greater review of systems completed and are negative except where specified in the HPI.  Allergies  Hydrocodone; Naproxen sodium; Percocet; and Zolpidem  Home Medications   Current Outpatient Rx  Name  Route  Sig  Dispense  Refill  . OVER THE COUNTER MEDICATION   Oral   Take 1 tablet by mouth at bedtime as needed. 'Pain reliever PM tablet.' For sleep and pain.           BP 131/89  Pulse 86  Temp(Src) 98 F (36.7 C) (Oral)  Resp 18  SpO2 100%  LMP 06/04/2012  Physical  Exam  Nursing notes reviewed.  Electronic medical record reviewed. VITAL SIGNS:   Filed Vitals:   06/20/12 0203 06/20/12 0531  BP: 131/89 99/52  Pulse: 86 65  Temp: 98 F (36.7 C) 97.8 F (36.6 C)  TempSrc: Oral Oral  Resp: 18 15  SpO2: 100% 100%   CONSTITUTIONAL: Awake, oriented, appears non-toxic HENT: Atraumatic, normocephalic, oral mucosa pink and moist, airway patent. Nares patent without drainage. External ears normal. EYES: Conjunctiva clear, EOMI, PERRLA NECK: Trachea midline, non-tender, supple CARDIOVASCULAR: Normal heart rate, Normal rhythm, No murmurs, rubs, gallops PULMONARY/CHEST: Clear to auscultation, no rhonchi, wheezes, or rales. Symmetrical breath sounds. Chest TTP lateral to the left side of the sternum and under left breast.  BACK: Tender to palpation over insertion of left rhomboid muscle. No midline tenderness. ABDOMINAL: Non-distended, soft, non-tender - no rebound or guarding.  BS normal. NEUROLOGIC: Non-focal, moving all four extremities, no gross sensory or motor deficits. EXTREMITIES: No clubbing, cyanosis, or edema.   TTP over the humeral head.  PT has positive empty can test and lift off test. SKIN: Warm, Dry, No erythema, No rash  ED Course  Procedures (including critical care time)  Date: 06/20/2012  Rate: 87  Rhythm:  normal sinus rhythm  QRS Axis: normal  Intervals: normal  ST/T Wave abnormalities: normal  Conduction Disutrbances: none  Narrative Interpretation: Normal sinus rhythm, unremarkable, no change when compared with prior EKG dated 02/01/2012  Labs Reviewed  CBC WITH DIFFERENTIAL - Abnormal; Notable for the following:    RBC 3.85 (*)    HCT 35.3 (*)    All other components within normal limits  COMPREHENSIVE METABOLIC PANEL - Abnormal; Notable for the following:    Potassium 3.4 (*)    All other components within normal limits  TROPONIN I  LIPASE, BLOOD  HEPATIC FUNCTION PANEL   Dg Chest 2 View  06/20/2012  *RADIOLOGY REPORT*   Clinical Data: Chest pain  CHEST - 2 VIEW  Comparison: 02/01/2012  Findings: Cardiomediastinal contours are within normal limits. Lungs are clear.  No pleural effusion or pneumothorax.  No acute osseous finding.  IMPRESSION: No radiographic evidence of acute cardiopulmonary process.   Original Report Authenticated By: Jearld Lesch, M.D.    1. Chest pain   2. Rhomboid muscle pain     Medications  ibuprofen (ADVIL,MOTRIN) tablet 800 mg (800 mg Oral Given 06/20/12 0311)  gi cocktail (Maalox,Lidocaine,Donnatal) (30 mLs Oral Given 06/20/12 0309)  HYDROcodone-acetaminophen (NORCO/VICODIN) 5-325 MG per tablet 2 tablet (2 tablets Oral Given 06/20/12 0407)     MDM  TNIYAH NAKAGAWA is a 42 y.o. female presents with chest pain and back pain.  On physical exam, CP and back pain is reproducible.  Presentation is not concerning for thoracic aortic dissection.  I think ACS is unlikely and pt is PERC negative.  EKG and trop are unremarkable - CP for >2 days should yield positive troponin if related to coronary ischemia/infarct.  Pt is low cardiac risk.  PT shoulder exam suggestive of rotator cuff disease.  On further history, she used to work at a [employer] with repetitive overhead reaching motions wit h the right arm and injured the right arm during her employment. CXR is clear,  Do not suspect pulmonary disease or infection, VSS/WNL, no cough, no wheeze, no h/o asthma flare, or dyspnea.  Abdomen is uninvolved at this time, pt may have some mild AGE.    I explained the diagnosis and have given explicit precautions to return to the ER including any other new or worsening symptoms. The patient understands and accepts the medical plan as it's been dictated and I have answered their questions. Discharge instructions concerning home care and prescriptions have been given.  The patient is STABLE and is discharged to home in good condition.         Jones Skene, MD 06/20/12 1130

## 2012-11-29 ENCOUNTER — Emergency Department (HOSPITAL_COMMUNITY): Payer: Self-pay

## 2012-11-29 ENCOUNTER — Encounter (HOSPITAL_COMMUNITY): Payer: Self-pay | Admitting: Emergency Medicine

## 2012-11-29 ENCOUNTER — Emergency Department (HOSPITAL_COMMUNITY)
Admission: EM | Admit: 2012-11-29 | Discharge: 2012-11-29 | Disposition: A | Discharge status: Home / Self Care | Payer: Self-pay | Attending: Emergency Medicine | Admitting: Emergency Medicine

## 2012-11-29 DIAGNOSIS — R0789 Other chest pain: Secondary | ICD-10-CM | POA: Insufficient documentation

## 2012-11-29 DIAGNOSIS — J45909 Unspecified asthma, uncomplicated: Secondary | ICD-10-CM | POA: Insufficient documentation

## 2012-11-29 DIAGNOSIS — F17200 Nicotine dependence, unspecified, uncomplicated: Secondary | ICD-10-CM | POA: Insufficient documentation

## 2012-11-29 DIAGNOSIS — M25519 Pain in unspecified shoulder: Secondary | ICD-10-CM | POA: Insufficient documentation

## 2012-11-29 LAB — COMPREHENSIVE METABOLIC PANEL
ALT: 24 U/L (ref 0–35)
AST: 30 U/L (ref 0–37)
Albumin: 3.4 g/dL — ABNORMAL LOW (ref 3.5–5.2)
Alkaline Phosphatase: 112 U/L (ref 39–117)
CO2: 25 mEq/L (ref 19–32)
Chloride: 102 mEq/L (ref 96–112)
Creatinine, Ser: 0.54 mg/dL (ref 0.50–1.10)
GFR calc non Af Amer: >90 mL/min (ref >90)
Potassium: 3.7 mEq/L (ref 3.5–5.1)
Sodium: 135 mEq/L (ref 135–145)
Total Bilirubin: 0.1 mg/dL — ABNORMAL LOW (ref 0.3–1.2)

## 2012-11-29 LAB — CBC WITH DIFFERENTIAL/PLATELET
Basophils Absolute: 0 10*3/uL (ref 0.0–0.1)
Basophils Relative: 0 % (ref 0–1)
HCT: 33.8 % — ABNORMAL LOW (ref 36.0–46.0)
Lymphocytes Relative: 32 % (ref 12–46)
Monocytes Absolute: 0.3 10*3/uL (ref 0.1–1.0)
Neutro Abs: 3.3 10*3/uL (ref 1.7–7.7)
Neutrophils Relative %: 62 % (ref 43–77)
RDW: 12.5 % (ref 11.5–15.5)
WBC: 5.4 10*3/uL (ref 4.0–10.5)

## 2012-11-29 MED ORDER — TRAMADOL HCL 50 MG PO TABS: 50.0000 mg | ORAL_TABLET | Freq: Four times a day (QID) | ORAL | Status: DC | PRN

## 2012-11-29 MED ORDER — DIAZEPAM 5 MG PO TABS
5.0000 mg | ORAL_TABLET | Freq: Once | ORAL | Status: AC
  Administered 2012-11-29: 5 mg via ORAL
  Filled 2012-11-29: qty 1

## 2012-11-29 MED ORDER — MORPHINE SULFATE 4 MG/ML IJ SOLN
4.0000 mg | Freq: Once | INTRAMUSCULAR | Status: AC
  Administered 2012-11-29: 4 mg via INTRAVENOUS
  Filled 2012-11-29: qty 1

## 2012-11-29 MED ORDER — IOHEXOL 350 MG/ML SOLN
100.0000 mL | Freq: Once | INTRAVENOUS | Status: AC | PRN
  Administered 2012-11-29: 100 mL via INTRAVENOUS

## 2012-11-29 MED ORDER — KETOROLAC TROMETHAMINE 30 MG/ML IJ SOLN
30.0000 mg | Freq: Once | INTRAMUSCULAR | Status: AC
Start: 2012-11-29 — End: 2012-11-29
  Administered 2012-11-29: 30 mg via INTRAVENOUS
  Filled 2012-11-29: qty 1

## 2012-11-29 NOTE — ED Provider Notes (Signed)
CSN: [id number]     Arrival date & time 11/29/12  1215 History     First MD Initiated Contact with Patient 11/29/12 1234     Chief Complaint  Patient presents with  . Chest Pain  . Shoulder Pain   (Consider location/radiation/quality/duration/timing/severity/associated sxs/prior Treatment) HPI Comments: Patient presents with a two-day history of left-sided chest pain it radiates to her back. It is constant but comes and goes in waves it never goes away completely. She denies any trauma. She had similar pain a few months ago and was told it was musculoskeletal. Denies any cardiac history. Never had a stress test. Denies any history of hypertension or diabetes. The pain is worse with palpation and worse with left arm movement. She denies any shortness of breath or cough. She took 2 aspirin at home with partial relief. Denies abdominal pain, nausea vomiting.  The history is provided by the patient.    Past Medical History  Diagnosis Date  . Asthma    Past Surgical History  Procedure Laterality Date  . Tubal ligation    . Cesarean section     No family history on file. History  Substance Use Topics  . Smoking status: Current Some Day Smoker    Types: Cigarettes  . Smokeless tobacco: Not on file  . Alcohol Use: Yes     Comment: ocasionally   OB History   Grav Para Term Preterm Abortions TAB SAB Ect Mult Living                 Review of Systems  Constitutional: Negative for fever, activity change and appetite change.  HENT: Negative for congestion and rhinorrhea.   Respiratory: Positive for chest tightness. Negative for cough and shortness of breath.   Cardiovascular: Positive for chest pain.  Gastrointestinal: Negative for nausea, vomiting and abdominal pain.  Genitourinary: Negative for dysuria, vaginal bleeding and vaginal discharge.  Musculoskeletal: Positive for myalgias, back pain and arthralgias. Negative for gait problem.  Skin: Negative for rash.  Neurological:  Negative for dizziness, weakness and headaches.  A complete 10 system review of systems was obtained and all systems are negative except as noted in the HPI and PMH.    Allergies  Hydrocodone; Naproxen sodium; Percocet; Shellfish allergy; and Zolpidem  Home Medications   Current Outpatient Rx  Name  Route  Sig  Dispense  Refill  . aspirin EC 81 MG tablet   Oral   Take 162 mg by mouth daily as needed for pain.         Marland Kitchen ibuprofen (ADVIL,MOTRIN) 200 MG tablet   Oral   Take 200 mg by mouth every 6 (six) hours as needed for pain.         . Multiple Vitamins-Minerals (HAIR/SKIN/NAILS PO)   Oral   Take 1 tablet by mouth daily.         . ST JOHNS WORT MOOD RELAXER PO   Oral   Take 1 tablet by mouth every morning.         . traMADol (ULTRAM) 50 MG tablet   Oral   Take 1 tablet (50 mg total) by mouth every 6 (six) hours as needed for pain.   15 tablet   0    BP 134/72  Pulse 82  Temp(Src) 98.2 F (36.8 C) (Oral)  Resp 18  SpO2 100%  LMP 10/29/2012 Physical Exam  Constitutional: She is oriented to person, place, and time. She appears well-developed and well-nourished. She appears distressed.  HENT:  Head: Normocephalic and atraumatic.  Mouth/Throat: Oropharynx is clear and moist. No oropharyngeal exudate.  Eyes: Conjunctivae and EOM are normal. Pupils are equal, round, and reactive to light.  Neck: Normal range of motion. Neck supple.  Cardiovascular: Normal rate, regular rhythm and normal heart sounds.   No murmur heard. Pulmonary/Chest: Effort normal and breath sounds normal. No respiratory distress. She exhibits tenderness.  Reproducible left chest wall tenderness at the sternal border and left breast. No rash.  Abdominal: Soft. There is no tenderness. There is no rebound and no guarding.  Musculoskeletal: Normal range of motion. She exhibits tenderness. She exhibits no edema.  TTP L paraspinal tenderness at rhomboid.  Equal grip strengths.  Equal radial  pulses.  Neurological: She is alert and oriented to person, place, and time. No cranial nerve deficit. She exhibits normal muscle tone. Coordination normal.  CN 2-12 intact, no ataxia on finger to nose, no nystagmus, 5/5 strength throughout, no pronator drift, Romberg negative, normal gait.   Skin: Skin is warm.    ED Course   Procedures (including critical care time)  Labs Reviewed  CBC WITH DIFFERENTIAL - Abnormal; Notable for the following:    RBC 3.65 (*)    Hemoglobin 11.6 (*)    HCT 33.8 (*)    All other components within normal limits  COMPREHENSIVE METABOLIC PANEL - Abnormal; Notable for the following:    Albumin 3.4 (*)    Total Bilirubin 0.1 (*)    All other components within normal limits  TROPONIN I  D-DIMER, QUANTITATIVE   Dg Chest 2 View  11/29/2012   *RADIOLOGY REPORT*  Clinical Data: Left-sided chest and back pain  CHEST - 2 VIEW  Comparison: Prior chest x-ray 06/20/2012  Findings: The lungs are well-aerated and free from pulmonary edema, focal airspace consolidation or pulmonary nodule.  Cardiac and mediastinal contours are within normal limits.  No pneumothorax, or pleural effusion. No acute osseous findings.  IMPRESSION:  No acute cardiopulmonary disease.   Original Report Authenticated By: Malachy Moan, M.D.   Ct Angio Chest Aortic Dissect W &/or W/o  11/29/2012   *RADIOLOGY REPORT*  Clinical Data:  Left shoulder and chest pain  CT ANGIOGRAPHY CHEST AND ABDOMEN  Technique:  Multidetector CT imaging of the chest and abdomen was performed using the standard protocol during bolus administration of intravenous contrast.  Multiplanar CT image reconstructions including MIPs were obtained to evaluate the vascular anatomy.  Contrast: OMNIPAQUE IOHEXOL 350 MG/ML SOLN  Comparison:   None.  CTA CHEST  Findings:  The lungs are well-aerated bilaterally without evidence of focal infiltrate or sizable effusion.  A 7 mm nodule is noted in the posterior costophrenic angle on  the left.  No other nodules are seen.  The thoracic aorta is within normal limits.  No evidence of dissection or aneurysmal dilatation is seen.  The origins of the great vessels are within normal limits.  The hilar and mediastinal structures are within normal limits.  No lymphadenopathy is seen. The osseous structures are within normal limits.   Review of the MIP images confirms the above findings.  IMPRESSION: 7 mm nodule in the posterior costophrenic angle on the left. If the patient is at high risk for bronchogenic carcinoma, follow-up chest CT at 3-6 months is recommended.  If the patient is at low risk for bronchogenic carcinoma, follow-up chest CT at 6-12 months is recommended.  This recommendation follows the consensus statement: Guidelines for Management of Small Pulmonary Nodules Detected on CT Scans: A  Statement from the Fleischner Society as published in Radiology 2005; 237:395-400.  No abnormality of the thoracic aorta.  CTA ABDOMEN  Findings:  The liver, spleen, gallbladder, adrenal glands and pancreas are within normal limits.  Kidneys show a normal enhancement pattern.  The aorta is well visualized demonstrates normal branching pattern. The visceral vessels are widely patent.  Single renal arteries are identified bilaterally.  No dissection or aneurysm is identified. No acute bony abnormality is noted.   Review of the MIP images confirms the above findings.  IMPRESSION: Unremarkable abdominal aorta.  No acute abnormality is seen.   Original Report Authenticated By: Alcide Clever, M.D.   1. Atypical chest pain     MDM  L sided chest pain, shoulder, back pain x 2 days.  Pain is reproducible to palpation. No neuro or pulse deficits.  CXR negative. EKG nonischemic.  Pain appears to be musculoskeletal and atypical for ACS or PE. Equal grip strength and radial pulses. Equal upper extremity blood pressures. Doubt dissection.  Treat for chest wall pain. Needs six month followup of lung nodule.    Date: 11/29/2012  Rate: 79  Rhythm: normal sinus rhythm  QRS Axis: normal  Intervals: normal  ST/T Wave abnormalities: normal  Conduction Disutrbances:none  Narrative Interpretation:   Old EKG Reviewed: unchanged      Glynn Octave, MD 11/29/12 1815

## 2012-11-29 NOTE — ED Notes (Signed)
Pt states that she is having left shoulder pain that goes into her chest and through to her back which begun last night. Pt stated that shoulder pain increases with movement.

## 2013-01-01 ENCOUNTER — Encounter: Payer: Self-pay | Admitting: Internal Medicine

## 2013-01-01 ENCOUNTER — Ambulatory Visit: Payer: PRIVATE HEALTH INSURANCE | Attending: Internal Medicine | Admitting: Internal Medicine

## 2013-01-01 ENCOUNTER — Ambulatory Visit: Payer: PRIVATE HEALTH INSURANCE

## 2013-01-01 VITALS — BP 96/61 | HR 87 | Temp 98.1°F | Resp 16 | Ht 60.0 in | Wt 160.0 lb

## 2013-01-01 DIAGNOSIS — J45909 Unspecified asthma, uncomplicated: Secondary | ICD-10-CM | POA: Insufficient documentation

## 2013-01-01 DIAGNOSIS — R0989 Other specified symptoms and signs involving the circulatory and respiratory systems: Secondary | ICD-10-CM | POA: Insufficient documentation

## 2013-01-01 DIAGNOSIS — R911 Solitary pulmonary nodule: Secondary | ICD-10-CM

## 2013-01-01 DIAGNOSIS — R5381 Other malaise: Secondary | ICD-10-CM

## 2013-01-01 DIAGNOSIS — R0609 Other forms of dyspnea: Secondary | ICD-10-CM | POA: Insufficient documentation

## 2013-01-01 DIAGNOSIS — K219 Gastro-esophageal reflux disease without esophagitis: Secondary | ICD-10-CM

## 2013-01-01 DIAGNOSIS — R0600 Dyspnea, unspecified: Secondary | ICD-10-CM

## 2013-01-01 DIAGNOSIS — R079 Chest pain, unspecified: Secondary | ICD-10-CM | POA: Insufficient documentation

## 2013-01-01 MED ORDER — ESZOPICLONE 2 MG PO TABS: 2.0000 mg | ORAL_TABLET | Freq: Two times a day (BID) | ORAL | Status: DC | PRN

## 2013-01-01 MED ORDER — ESZOPICLONE 2 MG PO TABS: 2.0000 mg | ORAL_TABLET | Freq: Every day | ORAL | Status: DC

## 2013-01-01 MED ORDER — TRAMADOL HCL 50 MG PO TABS: 50.0000 mg | ORAL_TABLET | Freq: Four times a day (QID) | ORAL | Status: DC | PRN

## 2013-01-01 MED ORDER — MONTELUKAST SODIUM 10 MG PO TABS: 10.0000 mg | ORAL_TABLET | Freq: Every day | ORAL | Status: DC

## 2013-01-01 MED ORDER — ESZOPICLONE 2 MG PO TABS: 2.0000 mg | ORAL_TABLET | Freq: Every evening | ORAL | Status: DC | PRN

## 2013-01-01 MED ORDER — OMEPRAZOLE 20 MG PO CPDR: 20.0000 mg | DELAYED_RELEASE_CAPSULE | Freq: Every day | ORAL | Status: DC

## 2013-01-01 NOTE — Progress Notes (Signed)
Pt is here to establish care. On 11/29/2012 pt went to the hospital for back pain that radiates up her neck and down her left arm. She had a CT scan and found that there was a spot on her left lung. She want to talk about her results. She has been feeling weak and is having SOB.

## 2013-01-01 NOTE — Progress Notes (Signed)
Patient ID: [name], female   DOB: 1971/04/25, 42 y.o.   MRN: [id number] Patient Demographics  Tammy Jacobs, is a 42 y.o. female  WUJ:[id number]  NFA:[id number]  DOB - 01/26/1971  Chief Complaint  Patient presents with  . Establish Care        Subjective:   Tammy Jacobs today is here to establish primary care. Patient has No headache, No chest pain, No abdominal pain - No Nausea, No new weakness tingling or numbness, No Cough  Patient is a 42 year old female with history of chronic asthma, GERD presented to the clinic for establishing care. Patient was seen on 11/29/12 in the ER for back pain that radiated to her neck, chest and left arm. Workup in the ED was negative and patient was DC'd home. Patient still feels weak and dyspnea on exertion. States has off and on chest pain but appears to have chest wall pain on palpation. She also complains of having GERD-like symptoms.  Objective:    Filed Vitals:   01/01/13 1509  BP: 96/61  Pulse: 87  Temp: 98.1 F (36.7 C)  TempSrc: Oral  Resp: 16  Height: 5' (1.524 m)  Weight: 160 lb (72.576 kg)  SpO2: 99%     ALLERGIES:   Allergies  Allergen Reactions  . Hydrocodone Shortness Of Breath  . Naproxen Sodium Shortness Of Breath  . Percocet [Oxycodone-Acetaminophen] Shortness Of Breath  . Shellfish Allergy Anaphylaxis  . Zolpidem Hives    PAST MEDICAL HISTORY: Past Medical History  Diagnosis Date  . Asthma     PAST SURGICAL HISTORY: Past Surgical History  Procedure Laterality Date  . Tubal ligation    . Cesarean section      FAMILY HISTORY: No family history on file.  MEDICATIONS AT HOME: Prior to Admission medications   Medication Sig Start Date End Date Taking? Authorizing Provider  aspirin EC 81 MG tablet Take 162 mg by mouth daily as needed for pain.   Yes Historical Provider, MD  Multiple Vitamins-Minerals (HAIR/SKIN/NAILS PO) Take 1 tablet by mouth daily.   Yes Historical Provider, MD  ST JOHNS  WORT MOOD RELAXER PO Take 1 tablet by mouth every morning.   Yes Historical Provider, MD  ibuprofen (ADVIL,MOTRIN) 200 MG tablet Take 200 mg by mouth every 6 (six) hours as needed for pain.    Historical Provider, MD  montelukast (SINGULAIR) 10 MG tablet Take 1 tablet (10 mg total) by mouth at bedtime. 01/01/13   Ripudeep Jenna Luo, MD  omeprazole (PRILOSEC) 20 MG capsule Take 1 capsule (20 mg total) by mouth daily. 01/01/13   Ripudeep Jenna Luo, MD  traMADol (ULTRAM) 50 MG tablet Take 1 tablet (50 mg total) by mouth every 6 (six) hours as needed for pain. 01/01/13   Ripudeep Jenna Luo, MD    REVIEW OF SYSTEMS:  Constitutional:   No   Fevers, chills, fatigue.  HEENT:    No headaches, Sore throat,   Cardio-vascular: Please see history of present illness  GI:  No abdominal pain, nausea, vomiting, diarrhea  Resp: + Shortness of breath,  No coughing up of blood.No cough.No wheezing.  Skin:  no rash or lesions.  GU:  no dysuria, change in color of urine, no urgency or frequency.  No flank pain.  Musculoskeletal: No joint pain or swelling.  No decreased range of motion.  No back pain.  Psych: No change in mood or affect. No depression or anxiety.  No memory loss.   Exam  General appearance :Awake,  alert, NAD, Speech Clear. HEENT: Atraumatic and Normocephalic, PERLA Neck: supple, no JVD. No cervical lymphadenopathy.  Chest: clear to auscultation bilaterally, no wheezing, rales or rhonchi CVS: S1 S2 regular, no murmurs.  Abdomen: soft, NBS, NT, ND, no gaurding, rigidity or rebound. Extremities: No cyanosis, clubbing, B/L Lower Ext shows no edema,  Neurology: Awake alert, and oriented X 3, CN II-XII intact, Non focal Skin:No Rash or lesions Wounds: N/A    Data Review   Basic Metabolic Panel: No results found for this basename: NA, K, CL, CO2, GLUCOSE, BUN, CREATININE, CALCIUM, MG, PHOS,  in the last 168 hours Liver Function Tests: No results found for this basename: AST, ALT, ALKPHOS,  BILITOT, PROT, ALBUMIN,  in the last 168 hours  CBC: No results found for this basename: WBC, NEUTROABS, HGB, HCT, MCV, PLT,  in the last 168 hours ------------------------------------------------------------------------------------------------------------------ No results found for this basename: HGBA1C,  in the last 72 hours ------------------------------------------------------------------------------------------------------------------ No results found for this basename: CHOL, HDL, LDLCALC, TRIG, CHOLHDL, LDLDIRECT,  in the last 72 hours ------------------------------------------------------------------------------------------------------------------ No results found for this basename: TSH, T4TOTAL, FREET3, T3FREE, THYROIDAB,  in the last 72 hours ------------------------------------------------------------------------------------------------------------------ No results found for this basename: VITAMINB12, FOLATE, FERRITIN, TIBC, IRON, RETICCTPCT,  in the last 72 hours  Coagulation profile  No results found for this basename: INR, PROTIME,  in the last 168 hours    Assessment & Plan   Active Problems: Dyspnea with chest pain: On palpation appears to have some costochondritis, patient had extensive workup in the ED -CT angiogram of the chest/abdomen showed No aortic dissection. 7 MM nodule in the posterior costophrenic angle on the left. Patient has quit smoking. Will followup CT chest in 6 months. If nodule remains stable then yearly - EKG did not show acute ST-T wave changes just above ischemia. Chest x-ray was negative for any cardiomegaly.  Patient however states that the chest pain improves with aspirin. Will obtain 2-D echocardiogram for EF, any wall motion abnormality. - She also has chronic asthma, does not use inhaler, states Singulair had helped her in the past. Will write a prescription for Singulair - Also has anemia on the CBC, hemoglobin 11.6, obtain anemia panel,  TSH  GERD: placed on PPI  Insomnia:: Patient reports that she has not been sleeping well, Ambien doesn't work - Will place on [employer] 2 mg daily PRN  Recommendations: Anemia panel, TSH, 2-D echo  Follow-up in 4 weeks.   RAI,RIPUDEEP M.D. 01/01/2013, 3:26 PM

## 2013-01-02 LAB — ANEMIA PANEL
%SAT: 37 % (ref 20–55)
ABS Retic: 41.7 10*3/uL (ref 19.0–186.0)
Iron: 104 ug/dL (ref 42–145)
UIBC: 174 ug/dL (ref 125–400)
Vitamin B-12: 474 pg/mL (ref 211–911)

## 2013-01-02 LAB — TSH: TSH: 1.035 u[IU]/mL (ref 0.350–4.500)

## 2013-01-09 ENCOUNTER — Ambulatory Visit (HOSPITAL_COMMUNITY)
Admission: RE | Admit: 2013-01-09 | Discharge: 2013-01-09 | Disposition: A | Discharge status: Home / Self Care | Payer: No Typology Code available for payment source | Source: Ambulatory Visit | Attending: Internal Medicine | Admitting: Internal Medicine

## 2013-01-09 DIAGNOSIS — M7989 Other specified soft tissue disorders: Secondary | ICD-10-CM | POA: Insufficient documentation

## 2013-01-09 DIAGNOSIS — R0600 Dyspnea, unspecified: Secondary | ICD-10-CM

## 2013-01-09 DIAGNOSIS — J45909 Unspecified asthma, uncomplicated: Secondary | ICD-10-CM | POA: Insufficient documentation

## 2013-01-09 DIAGNOSIS — R5381 Other malaise: Secondary | ICD-10-CM

## 2013-01-09 DIAGNOSIS — Z8249 Family history of ischemic heart disease and other diseases of the circulatory system: Secondary | ICD-10-CM | POA: Insufficient documentation

## 2013-01-09 DIAGNOSIS — K219 Gastro-esophageal reflux disease without esophagitis: Secondary | ICD-10-CM | POA: Insufficient documentation

## 2013-01-09 DIAGNOSIS — R0609 Other forms of dyspnea: Secondary | ICD-10-CM | POA: Insufficient documentation

## 2013-01-09 DIAGNOSIS — R079 Chest pain, unspecified: Secondary | ICD-10-CM

## 2013-01-09 DIAGNOSIS — R911 Solitary pulmonary nodule: Secondary | ICD-10-CM

## 2013-01-09 DIAGNOSIS — R0989 Other specified symptoms and signs involving the circulatory and respiratory systems: Secondary | ICD-10-CM | POA: Insufficient documentation

## 2013-01-09 DIAGNOSIS — I059 Rheumatic mitral valve disease, unspecified: Secondary | ICD-10-CM

## 2013-01-09 NOTE — Progress Notes (Signed)
  Echocardiogram 2D Echocardiogram has been performed.  Cathie Beams 01/09/2013, 10:39 AM

## 2013-02-02 ENCOUNTER — Encounter: Payer: Self-pay | Admitting: Internal Medicine

## 2013-02-02 ENCOUNTER — Ambulatory Visit: Payer: No Typology Code available for payment source | Attending: Internal Medicine | Admitting: Internal Medicine

## 2013-02-02 VITALS — BP 114/78 | HR 87 | Temp 99.1°F | Resp 18 | Wt 163.0 lb

## 2013-02-02 DIAGNOSIS — M62830 Muscle spasm of back: Secondary | ICD-10-CM

## 2013-02-02 DIAGNOSIS — Z0000 Encounter for general adult medical examination without abnormal findings: Secondary | ICD-10-CM

## 2013-02-02 DIAGNOSIS — M5380 Other specified dorsopathies, site unspecified: Secondary | ICD-10-CM | POA: Insufficient documentation

## 2013-02-02 DIAGNOSIS — J45909 Unspecified asthma, uncomplicated: Secondary | ICD-10-CM

## 2013-02-02 DIAGNOSIS — G4700 Insomnia, unspecified: Secondary | ICD-10-CM

## 2013-02-02 MED ORDER — ALBUTEROL SULFATE HFA 108 (90 BASE) MCG/ACT IN AERS: 2.0000 | INHALATION_SPRAY | Freq: Four times a day (QID) | RESPIRATORY_TRACT | Status: DC | PRN

## 2013-02-02 MED ORDER — TROLAMINE SALICYLATE 10 % EX CREA: TOPICAL_CREAM | CUTANEOUS | Status: DC | PRN

## 2013-02-02 NOTE — Progress Notes (Signed)
Patient ID: Tammy Jacobs, female   DOB: 1970/05/13, 42 y.o.   MRN: [id number]   HPI: This is a 42 year old female who comes in for a followup visit and refill on her medications appear  He states that the [facility] she was prescribed as $300 and she did not buy it. She takes Benadryl sometimes 3-4 tabs to help her sleep.  She's also been having spasms in her left lower back and would like help in treating this. It usually occurs when she is laying in bed at night.  She also needs her "pump "refilled meaning her albuterol inhaler She stopped smoking about a month when a spot on lungs was discovered  He would also like lab results reviewed-she recently had an anemia panel and a TSH which are normal. I suspect she had these labs ordered because she was feeling weak and she was slightly anemic when CBC was last checked in August 2014.   Allergies  Allergen Reactions  . Hydrocodone Shortness Of Breath  . Naproxen Sodium Shortness Of Breath  . Percocet [Oxycodone-Acetaminophen] Shortness Of Breath  . Shellfish Allergy Anaphylaxis  . Zolpidem Hives   Past Medical History  Diagnosis Date  . Asthma    Prior to Admission medications   Medication Sig Start Date End Date Taking? Authorizing Provider  aspirin EC 81 MG tablet Take 162 mg by mouth daily as needed for pain.   Yes Historical Provider, MD  ibuprofen (ADVIL,MOTRIN) 200 MG tablet Take 200 mg by mouth every 6 (six) hours as needed for pain.   Yes Historical Provider, MD  montelukast (SINGULAIR) 10 MG tablet Take 1 tablet (10 mg total) by mouth at bedtime. 01/01/13  Yes Ripudeep Jenna Luo, MD  Multiple Vitamins-Minerals (HAIR/SKIN/NAILS PO) Take 1 tablet by mouth daily.   Yes Historical Provider, MD  omeprazole (PRILOSEC) 20 MG capsule Take 1 capsule (20 mg total) by mouth daily. 01/01/13  Yes Ripudeep Jenna Luo, MD  traMADol (ULTRAM) 50 MG tablet Take 1 tablet (50 mg total) by mouth every 6 (six) hours as needed for pain. 01/01/13  Yes Ripudeep Jenna Luo, MD  albuterol (PROVENTIL HFA;VENTOLIN HFA) 108 (90 BASE) MCG/ACT inhaler Inhale 2 puffs into the lungs every 6 (six) hours as needed for wheezing. 02/02/13   Calvert Cantor, MD  ST JOHNS WORT MOOD RELAXER PO Take 1 tablet by mouth every morning.    Historical Provider, MD  trolamine salicylate (ASPERCREME/ALOE) 10 % cream Apply topically as needed. 02/02/13   Calvert Cantor, MD   No family history on file. History   Social History  . Marital Status: Divorced    Spouse Name: N/A    Number of Children: N/A  . Years of Education: N/A   Occupational History  . Not on file.   Social History Main Topics  . Smoking status: Former Smoker    Types: Cigarettes  . Smokeless tobacco: Not on file     Comment: quit 1 month ago  . Alcohol Use: No     Comment: ocasionally  . Drug Use: No  . Sexual Activity: Not on file   Other Topics Concern  . Not on file   Social History Narrative  . No narrative on file     Objective:   Filed Vitals:   02/02/13 1548  BP: 114/78  Pulse: 87  Temp: 99.1 F (37.3 C)  Resp: 18   weight 163 pounds Pulse ox 99%  Physical Exam ______ Constitutional: Appears well-developed and well-nourished. No distress.  ____ HENT: Normocephalic. External right and left ear normal. Oropharynx is clear and moist. ____ Eyes: Conjunctivae and EOM are normal. PERRLA, no scleral icterus. ____ Neck: Normal ROM. Neck supple. No JVD. No tracheal deviation. No thyromegaly. ____ CVS: RRR, S1/S2 +, no murmurs, no gallops, no carotid bruit.  Pulmonary: Effort and breath sounds normal, no stridor, rhonchi, wheezes, rales.  Abdominal: Soft. BS +,  no distension, tenderness, rebound or guarding. ________ Musculoskeletal: Normal range of motion. No edema -tender and left lumbar paraspinal area  Neuro: Alert. Normal reflexes, muscle tone coordination. No cranial nerve deficit. Skin: Skin is warm and dry. No rash noted. Not diaphoretic. No erythema. No pallor. ____ Psychiatric:  Normal mood and affect. Behavior, judgment, thought content normal. __  Lab Results  Component Value Date   WBC 5.4 11/29/2012   HGB 11.6* 11/29/2012   HCT 33.8* 11/29/2012   MCV 92.6 11/29/2012   PLT 290 11/29/2012   Lab Results  Component Value Date   CREATININE 0.54 11/29/2012   BUN 12 11/29/2012   NA 135 11/29/2012   K 3.7 11/29/2012   CL 102 11/29/2012   CO2 25 11/29/2012    No results found for this basename: HGBA1C   Lipid Panel  No results found for this basename: chol, trig, hdl, cholhdl, vldl, ldlcalc       Assessment and plan:   Patient Active Problem List   Diagnosis Date Noted  . Dyspnea 01/01/2013  . Other malaise and fatigue 01/01/2013  . Chest pain, unspecified 01/01/2013  . GERD (gastroesophageal reflux disease) 01/01/2013  . Solitary pulmonary nodule 01/01/2013  . Asthma, chronic 01/01/2013    Mammogram: Ordered Colonoscopy : Referral made  Pap Smear : Will need to be done on the next visit  LABS: She does not want any blood work drawn today and we can obtain these levels on the followup appointment Vitamin D : Lipid Panel:  Insomnia: Advised to try over-the-counter melatonin  Back pain/lumbar spasms: Advised to apply heat (she does not want to try ice) and Aspercreme or an equivalent of that along with stretching exercises.  Return in 6-8 weeks for a Pap smear vitamin D level and fasting lipid profile which I have ordered.  She has declined a flu vaccine.

## 2013-02-02 NOTE — Patient Instructions (Addendum)
For Sleep- Melatonin 2.5 mg- highest dose is 10 mg Benadryl 25 to 50 mg at bedtime  Back spasms- aspercreme 10% (salicylic acid creams). Ice or heat and stretches three times a day.

## 2013-02-02 NOTE — Progress Notes (Signed)
Pt is here for a f/u from last visit and needing refills on meds Voices no new concerns... Alert w/no signs of acute distress.

## 2013-02-03 ENCOUNTER — Ambulatory Visit: Payer: PRIVATE HEALTH INSURANCE | Admitting: Internal Medicine

## 2013-02-10 ENCOUNTER — Ambulatory Visit (HOSPITAL_COMMUNITY)
Admission: RE | Admit: 2013-02-10 | Discharge: 2013-02-10 | Disposition: A | Discharge status: Home / Self Care | Payer: No Typology Code available for payment source | Source: Ambulatory Visit | Attending: Internal Medicine | Admitting: Internal Medicine

## 2013-02-10 DIAGNOSIS — M62830 Muscle spasm of back: Secondary | ICD-10-CM

## 2013-02-10 DIAGNOSIS — J45909 Unspecified asthma, uncomplicated: Secondary | ICD-10-CM

## 2013-02-10 DIAGNOSIS — Z0000 Encounter for general adult medical examination without abnormal findings: Secondary | ICD-10-CM

## 2013-02-10 DIAGNOSIS — Z1231 Encounter for screening mammogram for malignant neoplasm of breast: Secondary | ICD-10-CM

## 2013-02-10 DIAGNOSIS — G4700 Insomnia, unspecified: Secondary | ICD-10-CM

## 2013-02-16 ENCOUNTER — Other Ambulatory Visit: Payer: Self-pay | Admitting: Internal Medicine

## 2013-02-16 DIAGNOSIS — R928 Other abnormal and inconclusive findings on diagnostic imaging of breast: Secondary | ICD-10-CM

## 2013-03-06 ENCOUNTER — Other Ambulatory Visit: Payer: Self-pay | Admitting: Internal Medicine

## 2013-03-06 DIAGNOSIS — R928 Other abnormal and inconclusive findings on diagnostic imaging of breast: Secondary | ICD-10-CM

## 2013-03-11 ENCOUNTER — Ambulatory Visit
Admission: RE | Admit: 2013-03-11 | Discharge: 2013-03-11 | Disposition: A | Discharge status: Home / Self Care | Payer: No Typology Code available for payment source | Source: Ambulatory Visit | Attending: Internal Medicine | Admitting: Internal Medicine

## 2013-03-11 DIAGNOSIS — R928 Other abnormal and inconclusive findings on diagnostic imaging of breast: Secondary | ICD-10-CM

## 2013-03-23 ENCOUNTER — Ambulatory Visit: Payer: No Typology Code available for payment source | Admitting: Internal Medicine

## 2013-04-09 ENCOUNTER — Ambulatory Visit: Payer: No Typology Code available for payment source | Attending: Internal Medicine | Admitting: Internal Medicine

## 2013-04-09 ENCOUNTER — Encounter: Payer: Self-pay | Admitting: Internal Medicine

## 2013-04-09 VITALS — BP 116/77 | HR 83 | Temp 98.8°F | Resp 14 | Ht 60.0 in | Wt 167.4 lb

## 2013-04-09 DIAGNOSIS — K0889 Other specified disorders of teeth and supporting structures: Secondary | ICD-10-CM

## 2013-04-09 DIAGNOSIS — R079 Chest pain, unspecified: Secondary | ICD-10-CM

## 2013-04-09 DIAGNOSIS — R5381 Other malaise: Secondary | ICD-10-CM

## 2013-04-09 DIAGNOSIS — F411 Generalized anxiety disorder: Secondary | ICD-10-CM | POA: Insufficient documentation

## 2013-04-09 DIAGNOSIS — K219 Gastro-esophageal reflux disease without esophagitis: Secondary | ICD-10-CM

## 2013-04-09 DIAGNOSIS — J45909 Unspecified asthma, uncomplicated: Secondary | ICD-10-CM

## 2013-04-09 DIAGNOSIS — R0600 Dyspnea, unspecified: Secondary | ICD-10-CM

## 2013-04-09 MED ORDER — TRAMADOL HCL 50 MG PO TABS: 50.0000 mg | ORAL_TABLET | Freq: Four times a day (QID) | ORAL | Status: DC | PRN

## 2013-04-09 MED ORDER — MONTELUKAST SODIUM 10 MG PO TABS: 10.0000 mg | ORAL_TABLET | Freq: Every day | ORAL | Status: DC

## 2013-04-09 MED ORDER — ALBUTEROL SULFATE HFA 108 (90 BASE) MCG/ACT IN AERS: 2.0000 | INHALATION_SPRAY | Freq: Four times a day (QID) | RESPIRATORY_TRACT | Status: DC | PRN

## 2013-04-09 MED ORDER — CYCLOBENZAPRINE HCL 10 MG PO TABS: 10.0000 mg | ORAL_TABLET | Freq: Two times a day (BID) | ORAL | Status: DC | PRN

## 2013-04-09 NOTE — Progress Notes (Unsigned)
Pt is here for a f/u for back spasms. Requests to speak to provider about a different medication. Wasn't able to purchase her inhaler.

## 2013-04-09 NOTE — Progress Notes (Unsigned)
Patient ID: Tammy Jacobs, female   DOB: [birth year], 42 y.o.   MRN: [id number] Patient Demographics  Tammy Jacobs, is a 42 y.o. female  NWG:[id number]  [id number]  DOB - Sep 27, 1970  Chief Complaint  Patient presents with  . Follow-up        Subjective:   Tammy Jacobs today is here for a follow up visit.  Patient is a 42 year old female with history of GERD, chronic asthma, anxiety, insomnia presented for followup appointment. Patient reports that she was unable to fill up albuterol inhaler as she lost the prescription. States omeprazole is not working, she is taking some natural remedy which is helping. She is having significant family issues as stressors with her 44 year old twins causing significant anxiety and anger issues  Patient has No headache, No chest pain, No abdominal pain - No Nausea, No new weakness tingling or numbness, No Cough - SOB.   Objective:    Filed Vitals:   04/09/13 1556  BP: 116/77  Pulse: 83  Temp: 98.8 F (37.1 C)  TempSrc: Oral  Resp: 14  Height: 5' (1.524 m)  Weight: 167 lb 6.4 oz (75.932 kg)  SpO2: 100%     ALLERGIES:   Allergies  Allergen Reactions  . Hydrocodone Shortness Of Breath  . Naproxen Sodium Shortness Of Breath  . Percocet [Oxycodone-Acetaminophen] Shortness Of Breath  . Shellfish Allergy Anaphylaxis  . Zolpidem Hives    PAST MEDICAL HISTORY: Past Medical History  Diagnosis Date  . Asthma     MEDICATIONS AT HOME: Prior to Admission medications   Medication Sig Start Date End Date Taking? Authorizing Provider  aspirin EC 81 MG tablet Take 162 mg by mouth daily as needed for pain.   Yes Historical Provider, MD  ibuprofen (ADVIL,MOTRIN) 200 MG tablet Take 200 mg by mouth every 6 (six) hours as needed for pain.   Yes Historical Provider, MD  montelukast (SINGULAIR) 10 MG tablet Take 1 tablet (10 mg total) by mouth at bedtime. 04/09/13  Yes Shaniece Bussa Jenna Luo, MD  Multiple Vitamins-Minerals (HAIR/SKIN/NAILS PO) Take  1 tablet by mouth daily.   Yes Historical Provider, MD  omeprazole (PRILOSEC) 20 MG capsule Take 1 capsule (20 mg total) by mouth daily. 01/01/13  Yes Eshal Propps Jenna Luo, MD  ST JOHNS WORT MOOD RELAXER PO Take 1 tablet by mouth every morning.   Yes Historical Provider, MD  albuterol (PROVENTIL HFA;VENTOLIN HFA) 108 (90 BASE) MCG/ACT inhaler Inhale 2 puffs into the lungs every 6 (six) hours as needed for wheezing. 04/09/13   Aftan Vint Jenna Luo, MD  cyclobenzaprine (FLEXERIL) 10 MG tablet Take 1 tablet (10 mg total) by mouth 2 (two) times daily as needed for muscle spasms (take one every night). 04/09/13   Darica Goren Jenna Luo, MD  traMADol (ULTRAM) 50 MG tablet Take 1 tablet (50 mg total) by mouth every 6 (six) hours as needed for moderate pain. 04/09/13   Ermel Verne Jenna Luo, MD  trolamine salicylate (ASPERCREME/ALOE) 10 % cream Apply topically as needed. 02/02/13   Calvert Cantor, MD     Exam  General appearance :Awake, alert, NAD, Speech Clear.  HEENT: Atraumatic and Normocephalic, PERLA, poor dental hygiene Neck: supple, no JVD. No cervical lymphadenopathy.  Chest: Clear to auscultation bilaterally, no wheezing, rales or rhonchi CVS: S1 S2 regular, no murmurs.  Abdomen: soft, NBS, NT, ND, no gaurding, rigidity or rebound. Extremities: no cyanosis or clubbing, B/L Lower Ext shows no edema Neurology: Awake alert, and oriented X 3, CN II-XII intact, Non focal Skin:  No Rash or lesions Wounds:N/A    Data Review   Basic Metabolic Panel: No results found for this basename: NA, K, CL, CO2, GLUCOSE, BUN, CREATININE, CALCIUM, MG, PHOS,  in the last 168 hours Liver Function Tests: No results found for this basename: AST, ALT, ALKPHOS, BILITOT, PROT, ALBUMIN,  in the last 168 hours  CBC: No results found for this basename: WBC, NEUTROABS, HGB, HCT, MCV, PLT,  in the last 168 hours  ------------------------------------------------------------------------------------------------------------------ No results found  for this basename: HGBA1C,  in the last 72 hours ------------------------------------------------------------------------------------------------------------------ No results found for this basename: CHOL, HDL, LDLCALC, TRIG, CHOLHDL, LDLDIRECT,  in the last 72 hours ------------------------------------------------------------------------------------------------------------------ No results found for this basename: TSH, T4TOTAL, FREET3, T3FREE, THYROIDAB,  in the last 72 hours ------------------------------------------------------------------------------------------------------------------ No results found for this basename: VITAMINB12, FOLATE, FERRITIN, TIBC, IRON, RETICCTPCT,  in the last 72 hours  Coagulation profile  No results found for this basename: INR, PROTIME,  in the last 168 hours    Assessment & Plan   Active Problems: Asthma - Currently stable, refill albuterol inhaler, Singulair prescription  Chronic pain -Continue tramadol as needed, Flexeril as needed   Anxiety with anger management - Ambulatory referral to psychiatry  Dental pain: Multiple cavities - Ambulatory referral to dentistry   Preventive  - Screening mammogram done, negative, repeat in one year   Follow-up in 4 months     Wyley Hack M.D. 04/09/2013, 4:27 PM

## 2013-04-16 ENCOUNTER — Emergency Department (HOSPITAL_COMMUNITY)
Admission: EM | Admit: 2013-04-16 | Discharge: 2013-04-16 | Disposition: A | Discharge status: Home / Self Care | Payer: No Typology Code available for payment source | Attending: Emergency Medicine | Admitting: Emergency Medicine

## 2013-04-16 ENCOUNTER — Emergency Department (HOSPITAL_COMMUNITY): Payer: No Typology Code available for payment source

## 2013-04-16 ENCOUNTER — Encounter (HOSPITAL_COMMUNITY): Payer: Self-pay | Admitting: Emergency Medicine

## 2013-04-16 DIAGNOSIS — M549 Dorsalgia, unspecified: Secondary | ICD-10-CM

## 2013-04-16 DIAGNOSIS — IMO0002 Reserved for concepts with insufficient information to code with codable children: Secondary | ICD-10-CM | POA: Insufficient documentation

## 2013-04-16 DIAGNOSIS — Y92410 Unspecified street and highway as the place of occurrence of the external cause: Secondary | ICD-10-CM | POA: Insufficient documentation

## 2013-04-16 DIAGNOSIS — Z87891 Personal history of nicotine dependence: Secondary | ICD-10-CM | POA: Insufficient documentation

## 2013-04-16 DIAGNOSIS — S0990XA Unspecified injury of head, initial encounter: Secondary | ICD-10-CM | POA: Insufficient documentation

## 2013-04-16 DIAGNOSIS — S0993XA Unspecified injury of face, initial encounter: Secondary | ICD-10-CM | POA: Insufficient documentation

## 2013-04-16 DIAGNOSIS — S298XXA Other specified injuries of thorax, initial encounter: Secondary | ICD-10-CM | POA: Insufficient documentation

## 2013-04-16 DIAGNOSIS — S46909A Unspecified injury of unspecified muscle, fascia and tendon at shoulder and upper arm level, unspecified arm, initial encounter: Secondary | ICD-10-CM | POA: Insufficient documentation

## 2013-04-16 DIAGNOSIS — Z79899 Other long term (current) drug therapy: Secondary | ICD-10-CM | POA: Insufficient documentation

## 2013-04-16 DIAGNOSIS — J45909 Unspecified asthma, uncomplicated: Secondary | ICD-10-CM | POA: Insufficient documentation

## 2013-04-16 DIAGNOSIS — S4980XA Other specified injuries of shoulder and upper arm, unspecified arm, initial encounter: Secondary | ICD-10-CM | POA: Insufficient documentation

## 2013-04-16 DIAGNOSIS — R42 Dizziness and giddiness: Secondary | ICD-10-CM | POA: Insufficient documentation

## 2013-04-16 DIAGNOSIS — Y9389 Activity, other specified: Secondary | ICD-10-CM | POA: Insufficient documentation

## 2013-04-16 MED ORDER — CYCLOBENZAPRINE HCL 10 MG PO TABS
10.0000 mg | ORAL_TABLET | Freq: Once | ORAL | Status: AC
  Administered 2013-04-16: 10 mg via ORAL
  Filled 2013-04-16: qty 1

## 2013-04-16 MED ORDER — DIAZEPAM 5 MG PO TABS: 5.0000 mg | ORAL_TABLET | Freq: Three times a day (TID) | ORAL | Status: DC | PRN
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
## 2013-04-16 NOTE — ED Provider Notes (Signed)
Medical screening examination/treatment/procedure(s) were performed by non-physician practitioner and as supervising physician I was immediately available for consultation/collaboration.  Jaxxen Voong M Lemar Bakos, MD 04/16/13 2236 
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
## 2013-04-22 ENCOUNTER — Ambulatory Visit: Payer: No Typology Code available for payment source | Admitting: Internal Medicine

## 2013-05-04 ENCOUNTER — Encounter: Payer: Self-pay | Admitting: Internal Medicine

## 2013-05-04 ENCOUNTER — Ambulatory Visit: Payer: No Typology Code available for payment source | Attending: Internal Medicine | Admitting: Internal Medicine

## 2013-05-04 VITALS — BP 114/74 | HR 86 | Temp 98.3°F | Resp 16 | Ht 60.0 in | Wt 176.0 lb

## 2013-05-04 DIAGNOSIS — K0889 Other specified disorders of teeth and supporting structures: Secondary | ICD-10-CM

## 2013-05-04 DIAGNOSIS — R5383 Other fatigue: Secondary | ICD-10-CM

## 2013-05-04 DIAGNOSIS — K089 Disorder of teeth and supporting structures, unspecified: Secondary | ICD-10-CM

## 2013-05-04 DIAGNOSIS — K219 Gastro-esophageal reflux disease without esophagitis: Secondary | ICD-10-CM

## 2013-05-04 DIAGNOSIS — R5381 Other malaise: Secondary | ICD-10-CM

## 2013-05-04 MED ORDER — DIAZEPAM 10 MG PO TABS: 5.0000 mg | ORAL_TABLET | Freq: Three times a day (TID) | ORAL | Status: DC | PRN
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
## 2013-05-04 NOTE — Patient Instructions (Signed)
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
## 2013-05-14 ENCOUNTER — Ambulatory Visit (HOSPITAL_COMMUNITY)
Admission: RE | Admit: 2013-05-14 | Discharge: 2013-05-14 | Disposition: A | Discharge status: Home / Self Care | Payer: No Typology Code available for payment source | Source: Ambulatory Visit | Attending: Internal Medicine | Admitting: Internal Medicine

## 2013-05-14 DIAGNOSIS — M79609 Pain in unspecified limb: Secondary | ICD-10-CM | POA: Insufficient documentation

## 2013-05-14 DIAGNOSIS — M47817 Spondylosis without myelopathy or radiculopathy, lumbosacral region: Secondary | ICD-10-CM | POA: Insufficient documentation

## 2013-05-15 ENCOUNTER — Telehealth: Payer: Self-pay

## 2013-05-15 NOTE — Telephone Encounter (Signed)
Patient is aware of her MRI results

## 2013-05-15 NOTE — Telephone Encounter (Signed)
Message copied by Dorothe Pea on Fri May 15, 2013  3:47 PM ------      Message from: Tresa Garter      Created: Fri May 15, 2013  3:05 PM       Please inform patient that her lumbar MRI did not show any significant abnormality. There is however an incidental finding of large uterus which may be due to fibroid. If patient is having heavy bleeding we need to investigate this  ultrasound. Please inform patient to remember to discuss her menstrual period or any vaginal bleeding in between periods with the physician during her next visit ------

## 2013-08-10 ENCOUNTER — Ambulatory Visit: Payer: No Typology Code available for payment source | Admitting: Internal Medicine

## 2013-11-19 ENCOUNTER — Ambulatory Visit: Payer: No Typology Code available for payment source | Attending: Internal Medicine | Admitting: Internal Medicine

## 2013-11-19 ENCOUNTER — Encounter: Payer: Self-pay | Admitting: Internal Medicine

## 2013-11-19 VITALS — BP 116/78 | HR 81 | Temp 98.3°F | Resp 16 | Ht 60.0 in | Wt 179.0 lb

## 2013-11-19 DIAGNOSIS — G894 Chronic pain syndrome: Secondary | ICD-10-CM | POA: Insufficient documentation

## 2013-11-19 DIAGNOSIS — Z79899 Other long term (current) drug therapy: Secondary | ICD-10-CM | POA: Insufficient documentation

## 2013-11-19 DIAGNOSIS — R1013 Epigastric pain: Secondary | ICD-10-CM

## 2013-11-19 DIAGNOSIS — D259 Leiomyoma of uterus, unspecified: Secondary | ICD-10-CM | POA: Insufficient documentation

## 2013-11-19 DIAGNOSIS — K2100 Gastro-esophageal reflux disease with esophagitis, without bleeding: Secondary | ICD-10-CM

## 2013-11-19 DIAGNOSIS — G4700 Insomnia, unspecified: Secondary | ICD-10-CM | POA: Insufficient documentation

## 2013-11-19 DIAGNOSIS — J45909 Unspecified asthma, uncomplicated: Secondary | ICD-10-CM | POA: Insufficient documentation

## 2013-11-19 DIAGNOSIS — K219 Gastro-esophageal reflux disease without esophagitis: Secondary | ICD-10-CM | POA: Insufficient documentation

## 2013-11-19 DIAGNOSIS — Z87891 Personal history of nicotine dependence: Secondary | ICD-10-CM | POA: Insufficient documentation

## 2013-11-19 LAB — POCT URINALYSIS DIPSTICK
Bilirubin, UA: NEGATIVE
GLUCOSE UA: NEGATIVE
Ketones, UA: NEGATIVE
Leukocytes, UA: NEGATIVE
NITRITE UA: NEGATIVE
PH UA: 6.5
Protein, UA: NEGATIVE
RBC UA: NEGATIVE
Spec Grav, UA: 1.025
UROBILINOGEN UA: 0.2

## 2013-11-19 MED ORDER — AMITRIPTYLINE HCL 50 MG PO TABS: 50.0000 mg | ORAL_TABLET | Freq: Every evening | ORAL | Status: DC | PRN

## 2013-11-19 MED ORDER — TRAMADOL HCL 50 MG PO TABS: 50.0000 mg | ORAL_TABLET | Freq: Four times a day (QID) | ORAL | Status: DC | PRN

## 2013-11-19 MED ORDER — PANTOPRAZOLE SODIUM 40 MG PO TBEC: 40.0000 mg | DELAYED_RELEASE_TABLET | Freq: Every day | ORAL | Status: DC

## 2013-11-19 NOTE — Patient Instructions (Signed)
High-Fiber Diet Fiber is found in fruits, vegetables, and grains. A high-fiber diet encourages the addition of more whole grains, legumes, fruits, and vegetables in your diet. The recommended amount of fiber for adult males is 38 g per day. For adult females, it is 25 g per day. Pregnant and lactating women should get 28 g of fiber per day. If you have a digestive or bowel problem, ask your caregiver for advice before adding high-fiber foods to your diet. Eat a variety of high-fiber foods instead of only a select few type of foods.  PURPOSE  To increase stool bulk.  To make bowel movements more regular to prevent constipation.  To lower cholesterol.  To prevent overeating. WHEN IS THIS DIET USED?  It may be used if you have constipation and hemorrhoids.  It may be used if you have uncomplicated diverticulosis (intestine condition) and irritable bowel syndrome.  It may be used if you need help with weight management.  It may be used if you want to add it to your diet as a protective measure against atherosclerosis, diabetes, and cancer. SOURCES OF FIBER  Whole-grain breads and cereals.  Fruits, such as apples, oranges, bananas, berries, prunes, and pears.  Vegetables, such as green peas, carrots, sweet potatoes, beets, broccoli, cabbage, spinach, and artichokes.  Legumes, such split peas, soy, lentils.  Almonds. FIBER CONTENT IN FOODS Starches and Grains / Dietary Fiber (g)  Cheerios, 1 cup / 3 g  Corn Flakes cereal, 1 cup / 0.7 g  Rice crispy treat cereal, 1 cup / 0.3 g  Instant oatmeal (cooked),  cup / 2 g  Frosted wheat cereal, 1 cup / 5.1 g  Brown, long-grain rice (cooked), 1 cup / 3.5 g  White, long-grain rice (cooked), 1 cup / 0.6 g  Enriched macaroni (cooked), 1 cup / 2.5 g Legumes / Dietary Fiber (g)  Baked beans (canned, plain, or vegetarian),  cup / 5.2 g  Kidney beans (canned),  cup / 6.8 g  Pinto beans (cooked),  cup / 5.5 g Breads and Crackers  / Dietary Fiber (g)  Plain or honey graham crackers, 2 squares / 0.7 g  Saltine crackers, 3 squares / 0.3 g  Plain, salted pretzels, 10 pieces / 1.8 g  Whole-wheat bread, 1 slice / 1.9 g  White bread, 1 slice / 0.7 g  Raisin bread, 1 slice / 1.2 g  Plain bagel, 3 oz / 2 g  Flour tortilla, 1 oz / 0.9 g  Corn tortilla, 1 small / 1.5 g  Hamburger or hotdog bun, 1 small / 0.9 g Fruits / Dietary Fiber (g)  Apple with skin, 1 medium / 4.4 g  Sweetened applesauce,  cup / 1.5 g  Banana,  medium / 1.5 g  Grapes, 10 grapes / 0.4 g  Orange, 1 small / 2.3 g  Raisin, 1.5 oz / 1.6 g  Melon, 1 cup / 1.4 g Vegetables / Dietary Fiber (g)  Green beans (canned),  cup / 1.3 g  Carrots (cooked),  cup / 2.3 g  Broccoli (cooked),  cup / 2.8 g  Peas (cooked),  cup / 4.4 g  Mashed potatoes,  cup / 1.6 g  Lettuce, 1 cup / 0.5 g  Corn (canned),  cup / 1.6 g  Tomato,  cup / 1.1 g Document Released: 04/16/2005 Document Revised: 10/16/2011 Document Reviewed: 07/19/2011 [employer] Patient Information 2015 [place], [place]. This information is not intended to replace advice given to you by your health care provider.  Make sure you discuss any questions you have with your health care provider. Exercise to Lose Weight Exercise and a healthy diet may help you lose weight. Your doctor may suggest specific exercises. EXERCISE IDEAS AND TIPS  Choose low-cost things you enjoy doing, such as walking, bicycling, or exercising to workout videos.  Take stairs instead of the elevator.  Walk during your lunch break.  Park your car further away from work or school.  Go to a gym or an exercise class.  Start with 5 to 10 minutes of exercise each day. Build up to 30 minutes of exercise 4 to 6 days a week.  Wear shoes with good support and comfortable clothes.  Stretch before and after working out.  Work out until you breathe harder and your heart beats faster.  Drink extra water when  you exercise.  Do not do so much that you hurt yourself, feel dizzy, or get very short of breath. Exercises that burn about 150 calories:  Running 1  miles in 15 minutes.  Playing volleyball for 45 to 60 minutes.  Washing and waxing a car for 45 to 60 minutes.  Playing touch football for 45 minutes.  Walking 1  miles in 35 minutes.  Pushing a stroller 1  miles in 30 minutes.  Playing basketball for 30 minutes.  Raking leaves for 30 minutes.  Bicycling 5 miles in 30 minutes.  Walking 2 miles in 30 minutes.  Dancing for 30 minutes.  Shoveling snow for 15 minutes.  Swimming laps for 20 minutes.  Walking up stairs for 15 minutes.  Bicycling 4 miles in 15 minutes.  Gardening for 30 to 45 minutes.  Jumping rope for 15 minutes.  Washing windows or floors for 45 to 60 minutes. Document Released: 05/19/2010 Document Revised: 07/09/2011 Document Reviewed: 05/19/2010 [facility] Patient Information 2015 [place], [place]. This information is not intended to replace advice given to you by your health care provider. Make sure you discuss any questions you have with your health care provider. Calorie Counting for Weight Loss Calories are energy you get from the things you eat and drink. Your body uses this energy to keep you going throughout the day. The number of calories you eat affects your weight. When you eat more calories than your body needs, your body stores the extra calories as fat. When you eat fewer calories than your body needs, your body burns fat to get the energy it needs. Calorie counting means keeping track of how many calories you eat and drink each day. If you make sure to eat fewer calories than your body needs, you should lose weight. In order for calorie counting to work, you will need to eat the number of calories that are right for you in a day to lose a healthy amount of weight per week. A healthy amount of weight to lose per week is usually 1-2 lb (0.5-0.9 kg).  A dietitian can determine how many calories you need in a day and give you suggestions on how to reach your calorie goal.  WHAT IS MY MY PLAN? My goal is to have __________ calories per day.  If I have this many calories per day, I should lose around __________ pounds per week. WHAT DO I NEED TO KNOW ABOUT CALORIE COUNTING? In order to meet your daily calorie goal, you will need to:  Find out how many calories are in each food you would like to eat. Try to do this before you eat.  Decide how  much of the food you can eat.  Write down what you ate and how many calories it had. Doing this is called keeping a food log. WHERE DO I FIND CALORIE INFORMATION? The number of calories in a food can be found on a Nutrition Facts label. Note that all the information on a label is based on a specific serving of the food. If a food does not have a Nutrition Facts label, try to look up the calories online or ask your dietitian for help. HOW DO I DECIDE HOW MUCH TO EAT? To decide how much of the food you can eat, you will need to consider both the number of calories in one serving and the size of one serving. This information can be found on the Nutrition Facts label. If a food does not have a Nutrition Facts label, look up the information online or ask your dietitian for help. Remember that calories are listed per serving. If you choose to have more than one serving of a food, you will have to multiply the calories per serving by the amount of servings you plan to eat. For example, the label on a package of bread might say that a serving size is 1 slice and that there are 90 calories in a serving. If you eat 1 slice, you will have eaten 90 calories. If you eat 2 slices, you will have eaten 180 calories. HOW DO I KEEP A FOOD LOG? After each meal, record the following information in your food log:  What you ate.  How much of it you ate.  How many calories it had.  Then, add up your calories. Keep your food  log near you, such as in a small notebook in your pocket. Another option is to use a mobile app or website. Some programs will calculate calories for you and show you how many calories you have left each time you add an item to the log. WHAT ARE SOME CALORIE COUNTING TIPS?  Use your calories on foods and drinks that will fill you up and not leave you hungry. Some examples of this include foods like nuts and nut butters, vegetables, lean proteins, and high-fiber foods (more than 5 g fiber per serving).  Eat nutritious foods and avoid empty calories. Empty calories are calories you get from foods or beverages that do not have many nutrients, such as candy and soda. It is better to have a nutritious high-calorie food (such as an avocado) than a food with few nutrients (such as a bag of chips).  Know how many calories are in the foods you eat most often. This way, you do not have to look up how many calories they have each time you eat them.  Look out for foods that may seem like low-calorie foods but are really high-calorie foods, such as baked goods, soda, and fat-free candy.  Pay attention to calories in drinks. Drinks such as sodas, specialty coffee drinks, alcohol, and juices have a lot of calories yet do not fill you up. Choose low-calorie drinks like water and diet drinks.  Focus your calorie counting efforts on higher calorie items. Logging the calories in a garden salad that contains only vegetables is less important than calculating the calories in a milk shake.  Find a way of tracking calories that works for you. Get creative. Most people who are successful find ways to keep track of how much they eat in a day, even if they do not count every calorie. WHAT  ARE SOME PORTION CONTROL TIPS?  Know how many calories are in a serving. This will help you know how many servings of a certain food you can have.  Use a measuring cup to measure serving sizes. This is helpful when you start out. With  time, you will be able to estimate serving sizes for some foods.  Take some time to put servings of different foods on your favorite plates, bowls, and cups so you know what a serving looks like.  Try not to eat straight from a bag or box. Doing this can lead to overeating. Put the amount you would like to eat in a cup or on a plate to make sure you are eating the right portion.  Use smaller plates, glasses, and bowls to prevent overeating. This is a quick and easy way to practice portion control. If your plate is smaller, less food can fit on it.  Try not to multitask while eating, such as watching TV or using your computer. If it is time to eat, sit down at a table and enjoy your food. Doing this will help you to start recognizing when you are full. It will also make you more aware of what and how much you are eating. HOW CAN I CALORIE COUNT WHEN EATING OUT?  Ask for smaller portion sizes or child-sized portions.  Consider sharing an entree and sides instead of getting your own entree.  If you get your own entree, eat only half. Ask for a box at the beginning of your meal and put the rest of your entree in it so you are not tempted to eat it.  Look for the calories on the menu. If calories are listed, choose the lower calorie options.  Choose dishes that include vegetables, fruits, whole grains, low-fat dairy products, and lean protein. Focusing on smart food choices from each of the 5 food groups can help you stay on track at restaurants.  Choose items that are boiled, broiled, grilled, or steamed.  Choose water, milk, unsweetened iced tea, or other drinks without added sugars. If you want an alcoholic beverage, choose a lower calorie option. For example, a regular margarita can have up to 700 calories and a glass of wine has around 150.  Stay away from items that are buttered, battered, fried, or served with cream sauce. Items labeled "crispy" are usually fried, unless stated  otherwise.  Ask for dressings, sauces, and syrups on the side. These are usually very high in calories, so do not eat much of them.  Watch out for salads. Many people think salads are a healthy option, but this is often not the case. Many salads come with bacon, fried chicken, lots of cheese, fried chips, and dressing. All of these items have a lot of calories. If you want a salad, choose a garden salad and ask for grilled meats or steak. Ask for the dressing on the side, or ask for olive oil and vinegar or lemon to use as dressing.  Estimate how many servings of a food you are given. For example, a serving of cooked rice is  cup or about the size of half a tennis ball or one cupcake wrapper. Knowing serving sizes will help you be aware of how much food you are eating at restaurants. The list below tells you how big or small some common portion sizes are based on everyday objects.  1 oz--4 stacked dice.  3 oz--1 deck of cards.  1 tsp--1 dice.  1  Tbsp-- a Ping-Pong ball.  2 Tbsp--1 Ping-Pong ball.   cup--1 tennis ball or 1 cupcake wrapper.  1 cup--1 baseball. Document Released: 04/16/2005 Document Revised: 08/31/2013 Document Reviewed: 02/19/2013 [facility] Patient Information 2015 [place], [place]. This information is not intended to replace advice given to you by your health care provider. Make sure you discuss any questions you have with your health care provider.
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
## 2013-11-25 ENCOUNTER — Ambulatory Visit (HOSPITAL_COMMUNITY)
Admission: RE | Admit: 2013-11-25 | Discharge: 2013-11-25 | Disposition: A | Discharge status: Home / Self Care | Payer: No Typology Code available for payment source | Source: Ambulatory Visit | Attending: Internal Medicine | Admitting: Internal Medicine

## 2013-11-25 DIAGNOSIS — D252 Subserosal leiomyoma of uterus: Secondary | ICD-10-CM | POA: Insufficient documentation

## 2013-11-25 DIAGNOSIS — D259 Leiomyoma of uterus, unspecified: Secondary | ICD-10-CM

## 2013-11-25 DIAGNOSIS — D250 Submucous leiomyoma of uterus: Secondary | ICD-10-CM | POA: Insufficient documentation

## 2013-11-25 DIAGNOSIS — N83209 Unspecified ovarian cyst, unspecified side: Secondary | ICD-10-CM | POA: Insufficient documentation

## 2013-11-26 ENCOUNTER — Telehealth: Payer: Self-pay | Admitting: *Deleted

## 2013-11-26 DIAGNOSIS — Z0000 Encounter for general adult medical examination without abnormal findings: Secondary | ICD-10-CM

## 2013-11-26 NOTE — Telephone Encounter (Signed)
I spoke to pt informed her of her results and I place the referral.

## 2013-11-26 NOTE — Telephone Encounter (Signed)
Message copied by Joan Mayans on Thu Nov 26, 2013  9:48 AM ------      Message from: Angelica Chessman E      Created: Wed Nov 25, 2013  5:27 PM       Please inform patient that her pelvic ultrasound shows an enlarged fibroid uterus. We will refer her to gynecologist for further evaluation and management            Please place a referral to gynecologist ------

## 2013-12-20 ENCOUNTER — Encounter (HOSPITAL_COMMUNITY): Payer: Self-pay | Admitting: Emergency Medicine

## 2013-12-20 ENCOUNTER — Emergency Department (HOSPITAL_COMMUNITY)
Admission: EM | Admit: 2013-12-20 | Discharge: 2013-12-20 | Disposition: A | Discharge status: Home / Self Care | Payer: Self-pay | Attending: Emergency Medicine | Admitting: Emergency Medicine

## 2013-12-20 ENCOUNTER — Emergency Department (HOSPITAL_COMMUNITY): Payer: No Typology Code available for payment source

## 2013-12-20 ENCOUNTER — Emergency Department (HOSPITAL_COMMUNITY): Payer: Self-pay

## 2013-12-20 DIAGNOSIS — Z3202 Encounter for pregnancy test, result negative: Secondary | ICD-10-CM | POA: Insufficient documentation

## 2013-12-20 DIAGNOSIS — Z79899 Other long term (current) drug therapy: Secondary | ICD-10-CM | POA: Insufficient documentation

## 2013-12-20 DIAGNOSIS — Z7982 Long term (current) use of aspirin: Secondary | ICD-10-CM | POA: Insufficient documentation

## 2013-12-20 DIAGNOSIS — J45909 Unspecified asthma, uncomplicated: Secondary | ICD-10-CM | POA: Insufficient documentation

## 2013-12-20 DIAGNOSIS — R110 Nausea: Secondary | ICD-10-CM | POA: Insufficient documentation

## 2013-12-20 DIAGNOSIS — R630 Anorexia: Secondary | ICD-10-CM | POA: Insufficient documentation

## 2013-12-20 DIAGNOSIS — R109 Unspecified abdominal pain: Secondary | ICD-10-CM | POA: Insufficient documentation

## 2013-12-20 DIAGNOSIS — Z87891 Personal history of nicotine dependence: Secondary | ICD-10-CM | POA: Insufficient documentation

## 2013-12-20 DIAGNOSIS — R1031 Right lower quadrant pain: Secondary | ICD-10-CM | POA: Insufficient documentation

## 2013-12-20 LAB — COMPREHENSIVE METABOLIC PANEL
ALBUMIN: 3.4 g/dL — AB (ref 3.5–5.2)
ALK PHOS: 140 U/L — AB (ref 39–117)
ALT: 19 U/L (ref 0–35)
ANION GAP: 12 (ref 5–15)
AST: 26 U/L (ref 0–37)
BUN: 18 mg/dL (ref 6–23)
CO2: 23 mEq/L (ref 19–32)
Calcium: 8.8 mg/dL (ref 8.4–10.5)
Chloride: 102 mEq/L (ref 96–112)
Creatinine, Ser: 0.66 mg/dL (ref 0.50–1.10)
GFR calc Af Amer: >90 mL/min (ref >90)
GFR calc non Af Amer: >90 mL/min (ref >90)
Glucose, Bld: 101 mg/dL — ABNORMAL HIGH (ref 70–99)
POTASSIUM: 4.1 meq/L (ref 3.7–5.3)
SODIUM: 137 meq/L (ref 137–147)
TOTAL PROTEIN: 7.6 g/dL (ref 6.0–8.3)
Total Bilirubin: 0.3 mg/dL (ref 0.3–1.2)

## 2013-12-20 LAB — URINALYSIS, ROUTINE W REFLEX MICROSCOPIC
Bilirubin Urine: NEGATIVE
GLUCOSE, UA: NEGATIVE mg/dL
Hgb urine dipstick: NEGATIVE
Ketones, ur: NEGATIVE mg/dL
LEUKOCYTES UA: NEGATIVE
Nitrite: NEGATIVE
PH: 6.5 (ref 5.0–8.0)
PROTEIN: NEGATIVE mg/dL
Specific Gravity, Urine: 1.023 (ref 1.005–1.030)
Urobilinogen, UA: 0.2 mg/dL (ref 0.0–1.0)

## 2013-12-20 LAB — CBC WITH DIFFERENTIAL/PLATELET
BASOS ABS: 0 10*3/uL (ref 0.0–0.1)
BASOS PCT: 1 % (ref 0–1)
EOS ABS: 0.1 10*3/uL (ref 0.0–0.7)
Eosinophils Relative: 2 % (ref 0–5)
HCT: 34.7 % — ABNORMAL LOW (ref 36.0–46.0)
Hemoglobin: 11.8 g/dL — ABNORMAL LOW (ref 12.0–15.0)
Lymphocytes Relative: 27 % (ref 12–46)
Lymphs Abs: 1.7 10*3/uL (ref 0.7–4.0)
MCH: 31.4 pg (ref 26.0–34.0)
MCHC: 34 g/dL (ref 30.0–36.0)
MCV: 92.3 fL (ref 78.0–100.0)
Monocytes Absolute: 0.4 10*3/uL (ref 0.1–1.0)
Monocytes Relative: 6 % (ref 3–12)
NEUTROS PCT: 64 % (ref 43–77)
Neutro Abs: 4 10*3/uL (ref 1.7–7.7)
PLATELETS: 296 10*3/uL (ref 150–400)
RBC: 3.76 MIL/uL — ABNORMAL LOW (ref 3.87–5.11)
RDW: 12.5 % (ref 11.5–15.5)
WBC: 6.2 10*3/uL (ref 4.0–10.5)

## 2013-12-20 LAB — POC URINE PREG, ED: Preg Test, Ur: NEGATIVE

## 2013-12-20 LAB — LIPASE, BLOOD: Lipase: 24 U/L (ref 11–59)

## 2013-12-20 MED ORDER — FENTANYL CITRATE 0.05 MG/ML IJ SOLN
50.0000 ug | Freq: Once | INTRAMUSCULAR | Status: AC
  Administered 2013-12-20: 50 ug via INTRAVENOUS
  Filled 2013-12-20: qty 2

## 2013-12-20 MED ORDER — IOHEXOL 300 MG/ML  SOLN
100.0000 mL | Freq: Once | INTRAMUSCULAR | Status: AC | PRN
  Administered 2013-12-20: 100 mL via INTRAVENOUS

## 2013-12-20 MED ORDER — ONDANSETRON HCL 4 MG/2ML IJ SOLN
4.0000 mg | Freq: Once | INTRAMUSCULAR | Status: AC
  Administered 2013-12-20: 4 mg via INTRAVENOUS
  Filled 2013-12-20: qty 2

## 2013-12-20 MED ORDER — IOHEXOL 300 MG/ML  SOLN
50.0000 mL | Freq: Once | INTRAMUSCULAR | Status: AC | PRN
  Administered 2013-12-20: 50 mL via ORAL

## 2013-12-20 MED ORDER — IBUPROFEN 600 MG PO TABS: 600.0000 mg | ORAL_TABLET | Freq: Four times a day (QID) | ORAL | Status: DC | PRN

## 2013-12-20 NOTE — Discharge Instructions (Signed)
Take motrin for pain.   Continue taking elavil for pain.   Follow up with your doctor.   Return to ER if you have severe pain, vomiting, fever.

## 2013-12-20 NOTE — ED Provider Notes (Signed)
CSN: [id number]     Arrival date & time 12/20/13  1322 History   First MD Initiated Contact with Patient 12/20/13 1347     Chief Complaint  Patient presents with  . Abdominal Pain     (Consider location/radiation/quality/duration/timing/severity/associated sxs/prior Treatment) Patient is a 43 y.o. female presenting with abdominal pain. The history is provided by the patient. No language interpreter was used.  Abdominal Pain Pain location:  RLQ Pain quality: aching and sharp   Pain radiates to:  Does not radiate Pain severity:  Severe Duration:  4 days Timing:  Constant Progression:  Worsening Chronicity:  New Context: awakening from sleep   Relieved by:  Nothing Worsened by:  Movement and palpation Ineffective treatments:  None tried Associated symptoms: anorexia and nausea   Associated symptoms: no chest pain, no chills, no cough, no diarrhea, no dysuria, no fatigue, no fever, no hematuria, no shortness of breath, no sore throat and no vomiting     Past Medical History  Diagnosis Date  . Asthma    Past Surgical History  Procedure Laterality Date  . Tubal ligation    . Cesarean section     No family history on file. History  Substance Use Topics  . Smoking status: Former Smoker    Types: Cigarettes  . Smokeless tobacco: Not on file     Comment: quit 1 month ago  . Alcohol Use: No     Comment: ocasionally   OB History   Grav Para Term Preterm Abortions TAB SAB Ect Mult Living                 Review of Systems  Constitutional: Negative for fever, chills, diaphoresis, activity change, appetite change and fatigue.  HENT: Negative for congestion, facial swelling, rhinorrhea and sore throat.   Eyes: Negative for photophobia and discharge.  Respiratory: Negative for cough, chest tightness and shortness of breath.   Cardiovascular: Negative for chest pain, palpitations and leg swelling.  Gastrointestinal: Positive for nausea, abdominal pain and anorexia. Negative for  vomiting and diarrhea.  Endocrine: Negative for polydipsia and polyuria.  Genitourinary: Negative for dysuria, frequency, hematuria, difficulty urinating and pelvic pain.  Musculoskeletal: Negative for arthralgias, back pain, neck pain and neck stiffness.  Skin: Negative for color change and wound.  Allergic/Immunologic: Negative for immunocompromised state.  Neurological: Negative for facial asymmetry, weakness, numbness and headaches.  Hematological: Does not bruise/bleed easily.  Psychiatric/Behavioral: Negative for confusion and agitation.      Allergies  Hydrocodone; Naproxen sodium; Percocet; Shellfish allergy; and Zolpidem  Home Medications   Prior to Admission medications   Medication Sig Start Date End Date Taking? Authorizing Provider  albuterol (PROVENTIL HFA;VENTOLIN HFA) 108 (90 BASE) MCG/ACT inhaler Inhale 2 puffs into the lungs every 6 (six) hours as needed for wheezing. 04/09/13  Yes Ripudeep Krystal Eaton, MD  amitriptyline (ELAVIL) 50 MG tablet Take 1 tablet (50 mg total) by mouth at bedtime as needed for sleep or pain. 11/19/13  Yes Angelica Chessman, MD  aspirin EC 81 MG tablet Take 162 mg by mouth daily as needed for pain.   Yes Historical Provider, MD  montelukast (SINGULAIR) 10 MG tablet Take 1 tablet (10 mg total) by mouth at bedtime. 04/09/13  Yes Ripudeep Krystal Eaton, MD  Multiple Vitamins-Minerals (HAIR/SKIN/NAILS PO) Take 1 tablet by mouth daily.   Yes Historical Provider, MD  pantoprazole (PROTONIX) 40 MG tablet Take 1 tablet (40 mg total) by mouth daily. 11/19/13  Yes Angelica Chessman, MD  ST JOHNS  WORT MOOD RELAXER PO Take 4 tablets by mouth 4 (four) times daily.    Yes Historical Provider, MD  traMADol (ULTRAM) 50 MG tablet Take 1 tablet (50 mg total) by mouth every 6 (six) hours as needed for moderate pain. 11/19/13   Angelica Chessman, MD   BP 131/83  Pulse 99  Temp(Src) 99.5 F (37.5 C) (Oral)  SpO2 98%  LMP 11/28/2013 Physical Exam  Constitutional: She is  oriented to person, place, and time. She appears well-developed and well-nourished. No distress.  HENT:  Head: Normocephalic and atraumatic.  Mouth/Throat: No oropharyngeal exudate.  Eyes: Pupils are equal, round, and reactive to light.  Neck: Normal range of motion. Neck supple.  Cardiovascular: Normal rate, regular rhythm and normal heart sounds.  Exam reveals no gallop and no friction rub.   No murmur heard. Pulmonary/Chest: Effort normal and breath sounds normal. No respiratory distress. She has no wheezes. She has no rales.  Abdominal: Soft. Bowel sounds are normal. She exhibits no distension and no mass. There is tenderness in the right lower quadrant. There is tenderness at McBurney's point. There is no rigidity, no rebound and no guarding.    Musculoskeletal: Normal range of motion. She exhibits no edema and no tenderness.  Neurological: She is alert and oriented to person, place, and time.  Skin: Skin is warm and dry.  Psychiatric: She has a normal mood and affect.    ED Course  Procedures (including critical care time) Labs Review Labs Reviewed  CBC WITH DIFFERENTIAL - Abnormal; Notable for the following:    RBC 3.76 (*)    Hemoglobin 11.8 (*)    HCT 34.7 (*)    All other components within normal limits  COMPREHENSIVE METABOLIC PANEL - Abnormal; Notable for the following:    Glucose, Bld 101 (*)    Albumin 3.4 (*)    Alkaline Phosphatase 140 (*)    All other components within normal limits  LIPASE, BLOOD  URINALYSIS, ROUTINE W REFLEX MICROSCOPIC  POC URINE PREG, ED    Imaging Review No results found.   EKG Interpretation None      MDM   Final diagnoses:  None   Pt is a 43 y.o. female with Pmhx as above who presents with RLQ pain since Wednesday, constant, worse w/ mvmt and palpation. She has had assoc nausea, anorexia, low grade temp. No vomiting, d/a, urinary symptoms, diarrhea.   +RLQ ttp w + McBurney's. No rebound or guarding.  WBC nml, Cr stable.  There  is clinical concern for appendicitis.  CT ab/pelvis ordered.  Dr. Darl Householder will follow CT read. Would consider TVUS should CT be negative and pain persists.         Ernestina Patches, MD 12/20/13 [id number]

## 2013-12-20 NOTE — ED Provider Notes (Signed)
  Physical Exam  BP 112/69  Pulse 90  Temp(Src) 99.1 F (37.3 C) (Rectal)  Resp 14  SpO2 100%  LMP 11/28/2013  Physical Exam  ED Course  Procedures  Care assumed at sign out from Dr. Tawnya Crook. Patient has RLQ pain. Sign out pending CT. CT showed nl appendix, small fluid in the pelvic. Pelvic US showed no torsion. She has chronic pain history. Is on Elavil. I offered tramadol but she just wants motrin.   Wandra Arthurs, MD 12/20/13 2041

## 2013-12-20 NOTE — ED Notes (Signed)
She c/o rlq area abd. Pain x 4 days.  She is in no distress.  She exhibits guarding behaviors, and c/o diminished appetite.

## 2014-01-12 ENCOUNTER — Telehealth: Payer: Self-pay | Admitting: Internal Medicine

## 2014-01-12 ENCOUNTER — Telehealth: Payer: Self-pay | Admitting: Emergency Medicine

## 2014-01-12 NOTE — Telephone Encounter (Signed)
Pt was prescribed Tramadol on 11/19/13 and states she accidentally misplaced script, at the time [facility] pharmacy was not dispensing, and patient was going to take it to [place]. Please f/u with pt, pt available in the a.m. around 10am.

## 2014-01-12 NOTE — Telephone Encounter (Signed)
Left message for pt to call when message received regarding  of pain med script

## 2014-02-08 ENCOUNTER — Encounter: Payer: Self-pay | Admitting: Emergency Medicine

## 2014-02-08 ENCOUNTER — Other Ambulatory Visit: Payer: Self-pay | Admitting: Emergency Medicine

## 2014-02-08 ENCOUNTER — Other Ambulatory Visit: Payer: Self-pay

## 2014-02-08 DIAGNOSIS — G894 Chronic pain syndrome: Secondary | ICD-10-CM

## 2014-02-08 MED ORDER — PREDNISONE 20 MG PO TABS: 20.0000 mg | ORAL_TABLET | Freq: Every day | ORAL | Status: DC

## 2014-02-08 MED ORDER — ALBUTEROL SULFATE HFA 108 (90 BASE) MCG/ACT IN AERS: 2.0000 | INHALATION_SPRAY | Freq: Four times a day (QID) | RESPIRATORY_TRACT | Status: DC | PRN

## 2014-02-08 MED ORDER — TRAMADOL HCL 50 MG PO TABS: 50.0000 mg | ORAL_TABLET | Freq: Four times a day (QID) | ORAL | Status: AC | PRN

## 2014-02-08 MED ORDER — DIPHENHYDRAMINE HCL 25 MG PO CAPS: 25.0000 mg | ORAL_CAPSULE | Freq: Four times a day (QID) | ORAL | Status: DC | PRN

## 2014-02-08 MED ORDER — CYCLOBENZAPRINE HCL 10 MG PO TABS: 10.0000 mg | ORAL_TABLET | Freq: Three times a day (TID) | ORAL | Status: DC | PRN

## 2014-02-08 MED ORDER — TRAMADOL HCL 50 MG PO TABS: 50.0000 mg | ORAL_TABLET | Freq: Four times a day (QID) | ORAL | Status: DC | PRN

## 2014-02-08 NOTE — Progress Notes (Unsigned)
Patient ID: Tammy Jacobs, female   DOB: Jan 09, 1971, 43 y.o.   MRN: [id number] Pt comes in suddenly to clinic with c/o itchiness due to allergic reaction to shellfish. Pt states yesterday evening she came home to friend warming up shrimp in the microwave when sx's started. States she was felling ok. No c/o swelling of the tongue,hives or chest pain,sob at the time. States the next day while at work she began itching all over. Pt took singular without relief. States she feel tired and slight sob. Sats 98%r/a without distress,no hives noted

## 2014-02-08 NOTE — Patient Instructions (Addendum)
Take Prednisone 20 mg tablet today and tomorrow, then take Benadryl 25 mg tablet every 8 hrs as needed. Use your Albuterol inhaler as needed every 6 hours Return to clinic by Friday if symptoms worsensSeafood Allergy  Seafood allergies are usually a life-long problem. People are usually only allergic to one seafood group. Seafood allergy does not increase the risk of iodine allergy. Some conditions (scombroid fish poisoning and [place] allergy) may seem like allergic reactions to seafood, but are separate conditions. Bad reactions may also occur after eating seafood infected or tainted by algae-derived neurotoxins (ciguatera and paralytic shellfish poisoning). SYMPTOMS  Many allergic reactions to food are mild. Mild symptoms may be limited to hives or swelling in one area. The most dangerous symptoms are:  Breathing difficulties. This may occur from breathing in seafood allergen fumes when food is being cooked or in seafood processing factories.  A drop in blood pressure (shock).  Anaphylaxis is a severe whole body reaction. This is the most severe form of allergic reaction. Other symptoms include:   Swelling of the face or throat.  Dizziness.  Difficulty thinking.  Intense sense of fear.  Tightness in the chest.  Vomiting.  Diarrhea. TYPES OF SEAFOOD There are many types of seafood. The major groups of sea life that trigger allergic reactions are:  VERTEBRATES  Scaly fish (salmon, cod, mackerel, sardines, herring, anchovies, tuna, trout, haddock, [place]).  INVERTEBRATES  Crustaceans (prawns/shrimps, lobster, crab, crayfish, yabbies).  Mollusks.  Shellfish (clams, mussels, oysters, scallops).  Cephalopods (octopus, cuttlefish, squid, calamari).  Gastropods (sea slugs, garden slugs, snails). As a rule, patients allergic to one group of seafood can usually tolerate those from another. Seafood allergy is most common in communities where seafood is an important part of  the diet, such as [place] and [place]. Sensitivity is more common in adults than children.  Occasionally, intense cooking will partially or completely destroy the triggering allergen. This may explain why some patients allergic to fresh fish are able to tolerate salmon or tuna in a can. AVOIDING THE ALLERGEN IS AN IMPORTANT PART OF MANAGEMENT. Complete avoidance of one or more groups of seafood is often advised. It may be difficult to achieve in practice. Accidental exposure is more likely to occur when eating away from home. This is most true when eating at seafood restaurants. OTHER POTENTIAL SOURCES OF ACCIDENTAL EXPOSURE AND CROSS-CONTAMINATION INCLUDE:  Seafood platters (best avoided).  Asian foods in which shellfish can be a common ingredient or contaminant (prawns in fried rice or soups).  Food may be rolled in the same batter or cooked in the same oil as seafood (take-out fish and chips).  Anchovies (fish) in Caesar salads and as an ingredient, or Worcestershire sauce.  Contaminated barbecues.  Fish extracts are also occasionally used to remove particulate matter from some beverages such as wine and beer. This process is called "fining." SEAFOOD ALLERGY AND IODINE ALLERGY ARE UNRELATED. Even though seafood is a rich source of natural iodine, allergic reactions to seafood proteins have a different mechanism to that of iodine. Iodine can be found in topical antiseptics and x-ray contrast agents. Patients allergic to seafood are not at an increased risk of allergic reactions to iodine. Those with iodine allergy are not at increased risk of seafood allergy. SEEK IMMEDIATE MEDICAL CARE IF:  You have difficulty breathing, or you are wheezing or have a tight feeling in your chest or throat.  You have a swollen mouth, or have hives, swelling or itching over your body.  You feel faint or pass out.  You develop chest pain or a worsening of the problems which originally caused you to seek  medical help. If you have eaten seafood and develop problems or symptoms that seem unusual for you, seek advice from your caregiver. If the problems are severe, call your local emergency medical service. Document Released: 10/06/2001 Document Revised: 07/09/2011 Document Reviewed: 07/31/2013 [facility] Patient Information 2015 [place], [place]. This information is not intended to replace advice given to you by your health care provider. Make sure you discuss any questions you have with your health care provider. Food Allergy A food allergy occurs from eating something you are sensitive to. Food allergies occur in all age groups. It may be passed to you from your parents (heredity).  CAUSES  Some common causes are cow's milk, seafood, eggs, nuts (including peanut butter), wheat, and soybeans. SYMPTOMS  Common problems are:   Swelling around the mouth.  An itchy, red rash.  Hives.  Vomiting.  Diarrhea. Severe allergic reactions are life-threatening. This reaction is called anaphylaxis. It can cause the mouth and throat to swell. This makes it hard to breathe and swallow. In severe reactions, only a small amount of food may be fatal within seconds. HOME CARE INSTRUCTIONS   If you are unsure what caused the reaction, keep a diary of foods eaten and symptoms that followed. Avoid foods that cause reactions.  If hives or rash are present:  Take medicines as directed.  Use an over-the-counter antihistamine (diphenhydramine) to treat hives and itching as needed.  Apply cold compresses to the skin or take baths in cool water. Avoid hot baths or showers. These will increase the redness and itching.  If you are severely allergic:  Hospitalization is often required following a severe reaction.  Wear a medical alert bracelet or necklace that describes the allergy.  Carry your anaphylaxis kit or epinephrine injection with you at all times. Both you and your family members should know how to use  this. This can be lifesaving if you have a severe reaction. If epinephrine is used, it is important for you to seek immediate medical care or call your local emergency services (911 in [place] When the epinephrine wears off, it can be followed by a delayed reaction, which can be fatal.  Replace your epinephrine immediately after use in case of another reaction.  Ask your caregiver for instructions if you have not been taught how to use an epinephrine injection.  Do not drive until medicines used to treat the reaction have worn off, unless approved by your caregiver. SEEK MEDICAL CARE IF:   You suspect a food allergy. Symptoms generally happen within 30 minutes of eating a food.  Your symptoms have not gone away within 2 days. See your caregiver sooner if symptoms are getting worse.  You develop new symptoms.  You want to retest yourself with a food or drink you think causes an allergic reaction. Never do this if an anaphylactic reaction to that food or drink has happened before.  There is a return of the symptoms which brought you to your caregiver. SEEK IMMEDIATE MEDICAL CARE IF:   You have trouble breathing, are wheezing, or you have a tight feeling in your chest or throat.  You have a swollen mouth, or you have hives, swelling, or itching all over your body. Use your epinephrine injection immediately. This is given into the outside of your thigh, deep into the muscle. Following use of the epinephrine injection, seek help  right away. Seek immediate medical care or call your local emergency services (911 in [place] MAKE SURE YOU:   Understand these instructions.  Will watch your condition.  Will get help right away if you are not doing well or get worse. Document Released: 04/13/2000 Document Revised: 07/09/2011 Document Reviewed: 12/04/2007 [facility] Patient Information 2015 [place], [place]. This information is not intended to replace advice given to you by your health care provider.  Make sure you discuss any questions you have with your health care provider.

## 2014-03-08 ENCOUNTER — Emergency Department (HOSPITAL_COMMUNITY)
Admission: EM | Admit: 2014-03-08 | Discharge: 2014-03-08 | Disposition: A | Discharge status: Home / Self Care | Payer: No Typology Code available for payment source | Attending: Emergency Medicine | Admitting: Emergency Medicine

## 2014-03-08 ENCOUNTER — Encounter (HOSPITAL_COMMUNITY): Payer: Self-pay | Admitting: Emergency Medicine

## 2014-03-08 DIAGNOSIS — Z79899 Other long term (current) drug therapy: Secondary | ICD-10-CM | POA: Insufficient documentation

## 2014-03-08 DIAGNOSIS — Z7982 Long term (current) use of aspirin: Secondary | ICD-10-CM | POA: Insufficient documentation

## 2014-03-08 DIAGNOSIS — Z7952 Long term (current) use of systemic steroids: Secondary | ICD-10-CM | POA: Insufficient documentation

## 2014-03-08 DIAGNOSIS — M722 Plantar fascial fibromatosis: Secondary | ICD-10-CM | POA: Insufficient documentation

## 2014-03-08 DIAGNOSIS — Z87891 Personal history of nicotine dependence: Secondary | ICD-10-CM | POA: Insufficient documentation

## 2014-03-08 DIAGNOSIS — J45909 Unspecified asthma, uncomplicated: Secondary | ICD-10-CM | POA: Insufficient documentation

## 2014-03-08 MED ORDER — IBUPROFEN 600 MG PO TABS: 600.0000 mg | ORAL_TABLET | Freq: Four times a day (QID) | ORAL | Status: DC | PRN

## 2014-03-08 NOTE — ED Provider Notes (Signed)
CSN: [id number]     Arrival date & time 03/08/14  1939 History   First MD Initiated Contact with Patient 03/08/14 2041     Chief Complaint  Patient presents with  . Foot Pain  . Claudication     (Consider location/radiation/quality/duration/timing/severity/associated sxs/prior Treatment) HPI Comments: Patient presents with pain of the bottom of her left foot.  She reports that the pain has been present for the past 2-3 days.  She denies injury or trauma.  She reports that the pain is worse when she wakes up in the morning.  She reports that she is on her feet a lot while at work.  Pain is worse with walking.  She took Tramadol for the pain today, which gave her mild relief.  She denies erythema, edema, or warmth of the foot.  She denies numbness or tingling.  Denies fever or chills.  No prior history of DVT or PE.  No prolonged travel or surgeries in the past 4 weeks.    The history is provided by the patient.    Past Medical History  Diagnosis Date  . Asthma    Past Surgical History  Procedure Laterality Date  . Tubal ligation    . Cesarean section     No family history on file. History  Substance Use Topics  . Smoking status: Former Smoker    Types: Cigarettes  . Smokeless tobacco: Not on file     Comment: quit 1 month ago  . Alcohol Use: No     Comment: ocasionally   OB History    No data available     Review of Systems  All other systems reviewed and are negative.     Allergies  Hydrocodone; Naproxen sodium; Percocet; Shellfish allergy; and Zolpidem  Home Medications   Prior to Admission medications   Medication Sig Start Date End Date Taking? Authorizing Provider  albuterol (PROVENTIL HFA;VENTOLIN HFA) 108 (90 BASE) MCG/ACT inhaler Inhale 2 puffs into the lungs every 6 (six) hours as needed for wheezing. 02/08/14  Yes Tresa Garter, MD  aspirin EC 81 MG tablet Take 162 mg by mouth daily as needed for pain.   Yes Historical Provider, MD  cyclobenzaprine  (FLEXERIL) 10 MG tablet Take 1 tablet (10 mg total) by mouth 3 (three) times daily as needed for muscle spasms. 02/08/14  Yes Tresa Garter, MD  diphenhydrAMINE (BENADRYL) 25 mg capsule Take 1 capsule (25 mg total) by mouth every 6 (six) hours as needed. Patient taking differently: Take 25 mg by mouth every 6 (six) hours as needed for allergies.  02/08/14  Yes Olugbemiga Essie Christine, MD  traMADol (ULTRAM) 50 MG tablet Take 1 tablet (50 mg total) by mouth every 6 (six) hours as needed for moderate pain. 02/08/14  Yes Tresa Garter, MD  amitriptyline (ELAVIL) 50 MG tablet Take 1 tablet (50 mg total) by mouth at bedtime as needed for sleep or pain. 11/19/13   Tresa Garter, MD  ibuprofen (ADVIL,MOTRIN) 600 MG tablet Take 1 tablet (600 mg total) by mouth every 6 (six) hours as needed. 12/20/13   Wandra Arthurs, MD  montelukast (SINGULAIR) 10 MG tablet Take 1 tablet (10 mg total) by mouth at bedtime. 04/09/13   Ripudeep Krystal Eaton, MD  pantoprazole (PROTONIX) 40 MG tablet Take 1 tablet (40 mg total) by mouth daily. 11/19/13   Tresa Garter, MD  predniSONE (DELTASONE) 20 MG tablet Take 1 tablet (20 mg total) by mouth daily with breakfast. 02/08/14  Tresa Garter, MD   BP 112/95 mmHg  Pulse 97  Temp(Src) 98.6 F (37 C)  Resp 18  SpO2 100%  LMP 02/16/2014 Physical Exam  Constitutional: She appears well-developed and well-nourished.  HENT:  Head: Normocephalic and atraumatic.  Mouth/Throat: Oropharynx is clear and moist.  Neck: Normal range of motion. Neck supple.  Cardiovascular: Normal rate, regular rhythm and normal heart sounds.   Pulses:      Dorsalis pedis pulses are 2+ on the right side, and 2+ on the left side.  Pulmonary/Chest: Effort normal and breath sounds normal.  Musculoskeletal:  Tenderness to palpation of the left plantar fascia. No erythema, edema, or warmth of the foot or leg.  Negative Homan's sign.  No tenderness to palpation of the left calf.  Neurological:  She is alert.  Distal sensation of left foot intact  Skin: Skin is warm and dry.  Psychiatric: She has a normal mood and affect.  Nursing note and vitals reviewed.   ED Course  Procedures (including critical care time) Labs Review Labs Reviewed - No data to display  Imaging Review No results found.   EKG Interpretation None      MDM   Final diagnoses:  Plantar fasciitis, left   Patient presents today with pain of the bottom of the left foot over the plantar fascia.  History and physical most consistent with Plantar Fascitis.  No injury or trauma.  No signs of infection.  Neurovascularly intact.  Patient instructed to take NSAIDS and use insoles in her shoes.  Patient stable for discharge.  Return precautions given.    Hyman Bible, PA-C 03/08/14 2231  Blanchie Dessert, MD 03/09/14 1520

## 2014-03-08 NOTE — Discharge Instructions (Signed)
Plantar Fasciitis  Plantar fasciitis is a common condition that causes foot pain. It is soreness (inflammation) of the band of tough fibrous tissue on the bottom of the foot that runs from the heel bone (calcaneus) to the ball of the foot. The cause of this soreness may be from excessive standing, poor fitting shoes, running on hard surfaces, being overweight, having an abnormal walk, or overuse (this is common in runners) of the painful foot or feet. It is also common in aerobic exercise dancers and ballet dancers.  SYMPTOMS   Most people with plantar fasciitis complain of:   Severe pain in the morning on the bottom of their foot especially when taking the first steps out of bed. This pain recedes after a few minutes of walking.   Severe pain is experienced also during walking following a long period of inactivity.   Pain is worse when walking barefoot or up stairs  DIAGNOSIS    Your caregiver will diagnose this condition by examining and feeling your foot.   Special tests such as X-rays of your foot, are usually not needed.  PREVENTION    Consult a sports medicine professional before beginning a new exercise program.   Walking programs offer a good workout. With walking there is a lower chance of overuse injuries common to runners. There is less impact and less jarring of the joints.   Begin all new exercise programs slowly. If problems or pain develop, decrease the amount of time or distance until you are at a comfortable level.   Wear good shoes and replace them regularly.   Stretch your foot and the heel cords at the back of the ankle (Achilles tendon) both before and after exercise.   Run or exercise on even surfaces that are not hard. For example, asphalt is better than pavement.   Do not run barefoot on hard surfaces.   If using a treadmill, vary the incline.   Do not continue to workout if you have foot or joint problems. Seek professional help if they do not improve.  HOME CARE INSTRUCTIONS     Avoid activities that cause you pain until you recover.   Use ice or cold packs on the problem or painful areas after working out.   Only take over-the-counter or prescription medicines for pain, discomfort, or fever as directed by your caregiver.   Soft shoe inserts or athletic shoes with air or gel sole cushions may be helpful.   If problems continue or become more severe, consult a sports medicine caregiver or your own health care provider. Cortisone is a potent anti-inflammatory medication that may be injected into the painful area. You can discuss this treatment with your caregiver.  MAKE SURE YOU:    Understand these instructions.   Will watch your condition.   Will get help right away if you are not doing well or get worse.  Document Released: 01/09/2001 Document Revised: 07/09/2011 Document Reviewed: 03/10/2008  Exit[employer] Patient Information 2015 [employer]. This information is not intended to replace advice given to you by your health care provider. Make sure you discuss any questions you have with your health care provider.

## 2014-03-08 NOTE — ED Notes (Signed)
Offered motrin for pain based on allergies, but patient declines at this time.

## 2014-03-08 NOTE — ED Notes (Signed)
Pt. reports pain at left foot radiating up to left posterior lower leg onset several days ago , denies injury , ambulatory , worse when walking , denies injury. Respirations unlabored.

## 2014-04-20 ENCOUNTER — Other Ambulatory Visit: Payer: Self-pay | Admitting: Emergency Medicine

## 2014-04-20 ENCOUNTER — Encounter: Payer: Self-pay | Admitting: Emergency Medicine

## 2014-04-20 MED ORDER — ONDANSETRON 4 MG PO TBDP: 4.0000 mg | ORAL_TABLET | Freq: Three times a day (TID) | ORAL | Status: DC | PRN

## 2014-04-20 NOTE — Patient Instructions (Signed)
Dehydration, Adult Dehydration is when you lose more fluids from the body than you take in. Vital organs like the kidneys, brain, and heart cannot function without a proper amount of fluids and salt. Any loss of fluids from the body can cause dehydration.  CAUSES   Vomiting.  Diarrhea.  Excessive sweating.  Excessive urine output.  Fever. SYMPTOMS  Mild dehydration  Thirst.  Dry lips.  Slightly dry mouth. Moderate dehydration  Very dry mouth.  Sunken eyes.  Skin does not bounce back quickly when lightly pinched and released.  Dark urine and decreased urine production.  Decreased tear production.  Headache. Severe dehydration  Very dry mouth.  Extreme thirst.  Rapid, weak pulse (more than 100 beats per minute at rest).  Cold hands and feet.  Not able to sweat in spite of heat and temperature.  Rapid breathing.  Blue lips.  Confusion and lethargy.  Difficulty being awakened.  Minimal urine production.  No tears. DIAGNOSIS  Your caregiver will diagnose dehydration based on your symptoms and your exam. Blood and urine tests will help confirm the diagnosis. The diagnostic evaluation should also identify the cause of dehydration. TREATMENT  Treatment of mild or moderate dehydration can often be done at home by increasing the amount of fluids that you drink. It is best to drink small amounts of fluid more often. Drinking too much at one time can make vomiting worse. Refer to the home care instructions below. Severe dehydration needs to be treated at the hospital where you will probably be given intravenous (IV) fluids that contain water and electrolytes. HOME CARE INSTRUCTIONS   Ask your caregiver about specific rehydration instructions.  Drink enough fluids to keep your urine clear or pale yellow.  Drink small amounts frequently if you have nausea and vomiting.  Eat as you normally do.  Avoid:  Foods or drinks high in sugar.  Carbonated  drinks.  Juice.  Extremely hot or cold fluids.  Drinks with caffeine.  Fatty, greasy foods.  Alcohol.  Tobacco.  Overeating.  Gelatin desserts.  Wash your hands well to avoid spreading bacteria and viruses.  Only take over-the-counter or prescription medicines for pain, discomfort, or fever as directed by your caregiver.  Ask your caregiver if you should continue all prescribed and over-the-counter medicines.  Keep all follow-up appointments with your caregiver. SEEK MEDICAL CARE IF:  You have abdominal pain and it increases or stays in one area (localizes).  You have a rash, stiff neck, or severe headache.  You are irritable, sleepy, or difficult to awaken.  You are weak, dizzy, or extremely thirsty. SEEK IMMEDIATE MEDICAL CARE IF:   You are unable to keep fluids down or you get worse despite treatment.  You have frequent episodes of vomiting or diarrhea.  You have blood or green matter (bile) in your vomit.  You have blood in your stool or your stool looks black and tarry.  You have not urinated in 6 to 8 hours, or you have only urinated a small amount of very dark urine.  You have a fever.  You faint. MAKE SURE YOU:   Understand these instructions.  Will watch your condition.  Will get help right away if you are not doing well or get worse. Document Released: 04/16/2005 Document Revised: 07/09/2011 Document Reviewed: 12/04/2010 [employer] Patient Information 2015 [employer]. This information is not intended to replace advice given to you by your health care provider. Make sure you discuss any questions you have with your health care   provider.  

## 2015-03-02 ENCOUNTER — Emergency Department (HOSPITAL_COMMUNITY)
Admission: EM | Admit: 2015-03-02 | Discharge: 2015-03-02 | Disposition: A | Discharge status: Home / Self Care | Payer: No Typology Code available for payment source | Attending: Emergency Medicine | Admitting: Emergency Medicine

## 2015-03-02 ENCOUNTER — Encounter (HOSPITAL_COMMUNITY): Payer: Self-pay | Admitting: Family Medicine

## 2015-03-02 DIAGNOSIS — Z7982 Long term (current) use of aspirin: Secondary | ICD-10-CM | POA: Insufficient documentation

## 2015-03-02 DIAGNOSIS — Y9289 Other specified places as the place of occurrence of the external cause: Secondary | ICD-10-CM | POA: Insufficient documentation

## 2015-03-02 DIAGNOSIS — S199XXA Unspecified injury of neck, initial encounter: Secondary | ICD-10-CM | POA: Insufficient documentation

## 2015-03-02 DIAGNOSIS — M62838 Other muscle spasm: Secondary | ICD-10-CM | POA: Insufficient documentation

## 2015-03-02 DIAGNOSIS — Y990 Civilian activity done for income or pay: Secondary | ICD-10-CM | POA: Insufficient documentation

## 2015-03-02 DIAGNOSIS — Z87891 Personal history of nicotine dependence: Secondary | ICD-10-CM | POA: Insufficient documentation

## 2015-03-02 DIAGNOSIS — Z7952 Long term (current) use of systemic steroids: Secondary | ICD-10-CM | POA: Insufficient documentation

## 2015-03-02 DIAGNOSIS — Z79899 Other long term (current) drug therapy: Secondary | ICD-10-CM | POA: Insufficient documentation

## 2015-03-02 DIAGNOSIS — J45909 Unspecified asthma, uncomplicated: Secondary | ICD-10-CM | POA: Insufficient documentation

## 2015-03-02 DIAGNOSIS — Y9389 Activity, other specified: Secondary | ICD-10-CM | POA: Insufficient documentation

## 2015-03-02 DIAGNOSIS — M542 Cervicalgia: Secondary | ICD-10-CM

## 2015-03-02 DIAGNOSIS — X509XXA Other and unspecified overexertion or strenuous movements or postures, initial encounter: Secondary | ICD-10-CM | POA: Insufficient documentation

## 2015-03-02 MED ORDER — METHOCARBAMOL 500 MG PO TABS: 500.0000 mg | ORAL_TABLET | Freq: Two times a day (BID) | ORAL | Status: DC

## 2015-03-02 NOTE — Discharge Instructions (Signed)
- Rest and avoid strenuous activity that worsens your neck pain - Use ice or heat to the area for symptom control. Use OTC pain relievers - Robaxin is a muscle relaxer and may make you drowsy. Do not take before driving or operating heavy machinery - Schedule a follow up appointment with your PCP if symptoms persist   Cervical Strain and Sprain With Rehab Cervical strain and sprain are injuries that commonly occur with "whiplash" injuries. Whiplash occurs when the neck is forcefully whipped backward or forward, such as during a motor vehicle accident or during contact sports. The muscles, ligaments, tendons, discs, and nerves of the neck are susceptible to injury when this occurs. RISK FACTORS Risk of having a whiplash injury increases if:  Osteoarthritis of the spine.  Situations that make head or neck accidents or trauma more likely.  High-risk sports (football, rugby, wrestling, hockey, auto racing, gymnastics, diving, contact karate, or boxing).  Poor strength and flexibility of the neck.  Previous neck injury.  Poor tackling technique.  Improperly fitted or padded equipment. SYMPTOMS   Pain or stiffness in the front or back of neck or both.  Symptoms may present immediately or up to 24 hours after injury.  Dizziness, headache, nausea, and vomiting.  Muscle spasm with soreness and stiffness in the neck.  Tenderness and swelling at the injury site. PREVENTION  Learn and use proper technique (avoid tackling with the head, spearing, and head-butting; use proper falling techniques to avoid landing on the head).  Warm up and stretch properly before activity.  Maintain physical fitness:  Strength, flexibility, and endurance.  Cardiovascular fitness.  Wear properly fitted and padded protective equipment, such as padded soft collars, for participation in contact sports. PROGNOSIS  Recovery from cervical strain and sprain injuries is dependent on the extent of the injury.  These injuries are usually curable in 1 week to 3 months with appropriate treatment.  RELATED COMPLICATIONS   Temporary numbness and weakness may occur if the nerve roots are damaged, and this may persist until the nerve has completely healed.  Chronic pain due to frequent recurrence of symptoms.  Prolonged healing, especially if activity is resumed too soon (before complete recovery). TREATMENT  Treatment initially involves the use of ice and medication to help reduce pain and inflammation. It is also important to perform strengthening and stretching exercises and modify activities that worsen symptoms so the injury does not get worse. These exercises may be performed at home or with a therapist. For patients who experience severe symptoms, a soft, padded collar may be recommended to be worn around the neck.  Improving your posture may help reduce symptoms. Posture improvement includes pulling your chin and abdomen in while sitting or standing. If you are sitting, sit in a firm chair with your buttocks against the back of the chair. While sleeping, try replacing your pillow with a small towel rolled to 2 inches in diameter, or use a cervical pillow or soft cervical collar. Poor sleeping positions delay healing.  For patients with nerve root damage, which causes numbness or weakness, the use of a cervical traction apparatus may be recommended. Surgery is rarely necessary for these injuries. However, cervical strain and sprains that are present at birth (congenital) may require surgery. MEDICATION   If pain medication is necessary, nonsteroidal anti-inflammatory medications, such as aspirin and ibuprofen, or other minor pain relievers, such as acetaminophen, are often recommended.  Do not take pain medication for 7 days before surgery.  Prescription pain relievers  may be given if deemed necessary by your caregiver. Use only as directed and only as much as you need. HEAT AND COLD:   Cold treatment  (icing) relieves pain and reduces inflammation. Cold treatment should be applied for 10 to 15 minutes every 2 to 3 hours for inflammation and pain and immediately after any activity that aggravates your symptoms. Use ice packs or an ice massage.  Heat treatment may be used prior to performing the stretching and strengthening activities prescribed by your caregiver, physical therapist, or athletic trainer. Use a heat pack or a warm soak. SEEK MEDICAL CARE IF:   Symptoms get worse or do not improve in 2 weeks despite treatment.  New, unexplained symptoms develop (drugs used in treatment may produce side effects). EXERCISES RANGE OF MOTION (ROM) AND STRETCHING EXERCISES - Cervical Strain and Sprain These exercises may help you when beginning to rehabilitate your injury. In order to successfully resolve your symptoms, you must improve your posture. These exercises are designed to help reduce the forward-head and rounded-shoulder posture which contributes to this condition. Your symptoms may resolve with or without further involvement from your physician, physical therapist or athletic trainer. While completing these exercises, remember:   Restoring tissue flexibility helps normal motion to return to the joints. This allows healthier, less painful movement and activity.  An effective stretch should be held for at least 20 seconds, although you may need to begin with shorter hold times for comfort.  A stretch should never be painful. You should only feel a gentle lengthening or release in the stretched tissue. STRETCH- Axial Extensors  Lie on your back on the floor. You may bend your knees for comfort. Place a rolled-up hand towel or dish towel, about 2 inches in diameter, under the part of your head that makes contact with the floor.  Gently tuck your chin, as if trying to make a "double chin," until you feel a gentle stretch at the base of your head.  Hold __________ seconds. Repeat __________  times. Complete this exercise __________ times per day.  STRETCH - Axial Extension   Stand or sit on a firm surface. Assume a good posture: chest up, shoulders drawn back, abdominal muscles slightly tense, knees unlocked (if standing) and feet hip width apart.  Slowly retract your chin so your head slides back and your chin slightly lowers. Continue to look straight ahead.  You should feel a gentle stretch in the back of your head. Be certain not to feel an aggressive stretch since this can cause headaches later.  Hold for __________ seconds. Repeat __________ times. Complete this exercise __________ times per day. STRETCH - Cervical Side Bend   Stand or sit on a firm surface. Assume a good posture: chest up, shoulders drawn back, abdominal muscles slightly tense, knees unlocked (if standing) and feet hip width apart.  Without letting your nose or shoulders move, slowly tip your right / left ear to your shoulder until your feel a gentle stretch in the muscles on the opposite side of your neck.  Hold __________ seconds. Repeat __________ times. Complete this exercise __________ times per day. STRETCH - Cervical Rotators   Stand or sit on a firm surface. Assume a good posture: chest up, shoulders drawn back, abdominal muscles slightly tense, knees unlocked (if standing) and feet hip width apart.  Keeping your eyes level with the ground, slowly turn your head until you feel a gentle stretch along the back and opposite side of your neck.  Hold  __________ seconds. Repeat __________ times. Complete this exercise __________ times per day. RANGE OF MOTION - Neck Circles   Stand or sit on a firm surface. Assume a good posture: chest up, shoulders drawn back, abdominal muscles slightly tense, knees unlocked (if standing) and feet hip width apart.  Gently roll your head down and around from the back of one shoulder to the back of the other. The motion should never be forced or painful.  Repeat  the motion 10-20 times, or until you feel the neck muscles relax and loosen. Repeat __________ times. Complete the exercise __________ times per day. STRENGTHENING EXERCISES - Cervical Strain and Sprain These exercises may help you when beginning to rehabilitate your injury. They may resolve your symptoms with or without further involvement from your physician, physical therapist, or athletic trainer. While completing these exercises, remember:   Muscles can gain both the endurance and the strength needed for everyday activities through controlled exercises.  Complete these exercises as instructed by your physician, physical therapist, or athletic trainer. Progress the resistance and repetitions only as guided.  You may experience muscle soreness or fatigue, but the pain or discomfort you are trying to eliminate should never worsen during these exercises. If this pain does worsen, stop and make certain you are following the directions exactly. If the pain is still present after adjustments, discontinue the exercise until you can discuss the trouble with your clinician. STRENGTH - Cervical Flexors, Isometric  Face a wall, standing about 6 inches away. Place a small pillow, a ball about 6-8 inches in diameter, or a folded towel between your forehead and the wall.  Slightly tuck your chin and gently push your forehead into the soft object. Push only with mild to moderate intensity, building up tension gradually. Keep your jaw and forehead relaxed.  Hold 10 to 20 seconds. Keep your breathing relaxed.  Release the tension slowly. Relax your neck muscles completely before you start the next repetition. Repeat __________ times. Complete this exercise __________ times per day. STRENGTH- Cervical Lateral Flexors, Isometric   Stand about 6 inches away from a wall. Place a small pillow, a ball about 6-8 inches in diameter, or a folded towel between the side of your head and the wall.  Slightly tuck your  chin and gently tilt your head into the soft object. Push only with mild to moderate intensity, building up tension gradually. Keep your jaw and forehead relaxed.  Hold 10 to 20 seconds. Keep your breathing relaxed.  Release the tension slowly. Relax your neck muscles completely before you start the next repetition. Repeat __________ times. Complete this exercise __________ times per day. STRENGTH - Cervical Extensors, Isometric   Stand about 6 inches away from a wall. Place a small pillow, a ball about 6-8 inches in diameter, or a folded towel between the back of your head and the wall.  Slightly tuck your chin and gently tilt your head back into the soft object. Push only with mild to moderate intensity, building up tension gradually. Keep your jaw and forehead relaxed.  Hold 10 to 20 seconds. Keep your breathing relaxed.  Release the tension slowly. Relax your neck muscles completely before you start the next repetition. Repeat __________ times. Complete this exercise __________ times per day. POSTURE AND BODY MECHANICS CONSIDERATIONS - Cervical Strain and Sprain Keeping correct posture when sitting, standing or completing your activities will reduce the stress put on different body tissues, allowing injured tissues a chance to heal and limiting painful experiences.  The following are general guidelines for improved posture. Your physician or physical therapist will provide you with any instructions specific to your needs. While reading these guidelines, remember:  The exercises prescribed by your provider will help you have the flexibility and strength to maintain correct postures.  The correct posture provides the optimal environment for your joints to work. All of your joints have less wear and tear when properly supported by a spine with good posture. This means you will experience a healthier, less painful body.  Correct posture must be practiced with all of your activities, especially  prolonged sitting and standing. Correct posture is as important when doing repetitive low-stress activities (typing) as it is when doing a single heavy-load activity (lifting). PROLONGED STANDING WHILE SLIGHTLY LEANING FORWARD When completing a task that requires you to lean forward while standing in one place for a long time, place either foot up on a stationary 2- to 4-inch high object to help maintain the best posture. When both feet are on the ground, the low back tends to lose its slight inward curve. If this curve flattens (or becomes too large), then the back and your other joints will experience too much stress, fatigue more quickly, and can cause pain.  RESTING POSITIONS Consider which positions are most painful for you when choosing a resting position. If you have pain with flexion-based activities (sitting, bending, stooping, squatting), choose a position that allows you to rest in a less flexed posture. You would want to avoid curling into a fetal position on your side. If your pain worsens with extension-based activities (prolonged standing, working overhead), avoid resting in an extended position such as sleeping on your stomach. Most people will find more comfort when they rest with their spine in a more neutral position, neither too rounded nor too arched. Lying on a non-sagging bed on your side with a pillow between your knees, or on your back with a pillow under your knees will often provide some relief. Keep in mind, being in any one position for a prolonged period of time, no matter how correct your posture, can still lead to stiffness. WALKING Walk with an upright posture. Your ears, shoulders, and hips should all line up. OFFICE WORK When working at a desk, create an environment that supports good, upright posture. Without extra support, muscles fatigue and lead to excessive strain on joints and other tissues. CHAIR:  A chair should be able to slide under your desk when your back  makes contact with the back of the chair. This allows you to work closely.  The chair's height should allow your eyes to be level with the upper part of your monitor and your hands to be slightly lower than your elbows.  Body position:  Your feet should make contact with the floor. If this is not possible, use a foot rest.  Keep your ears over your shoulders. This will reduce stress on your neck and low back.   This information is not intended to replace advice given to you by your health care provider. Make sure you discuss any questions you have with your health care provider.   Document Released: 04/16/2005 Document Revised: 05/07/2014 Document Reviewed: 07/29/2008 Elsevier Interactive Patient Education 2016 [place] therapy can help ease sore, stiff, injured, and tight muscles and joints. Heat relaxes your muscles, which may help ease your pain.  RISKS AND COMPLICATIONS If you have any of the following conditions, do not use heat therapy unless  your health care provider has approved:  Poor circulation.  Healing wounds or scarred skin in the area being treated.  Diabetes, heart disease, or high blood pressure.  Not being able to feel (numbness) the area being treated.  Unusual swelling of the area being treated.  Active infections.  Blood clots.  Cancer.  Inability to communicate pain. This may include young children and people who have problems with their brain function (dementia).  Pregnancy. Heat therapy should only be used on old, pre-existing, or long-lasting (chronic) injuries. Do not use heat therapy on new injuries unless directed by your health care provider. HOW TO USE HEAT THERAPY There are several different kinds of heat therapy, including:  Moist heat pack.  Warm water bath.  Hot water bottle.  Electric heating pad.  Heated gel pack.  Heated wrap.  Electric heating pad. Use the heat therapy method suggested by your  health care provider. Follow your health care provider's instructions on when and how to use heat therapy. GENERAL HEAT THERAPY RECOMMENDATIONS  Do not sleep while using heat therapy. Only use heat therapy while you are awake.  Your skin may turn pink while using heat therapy. Do not use heat therapy if your skin turns red.  Do not use heat therapy if you have new pain.  High heat or long exposure to heat can cause burns. Be careful when using heat therapy to avoid burning your skin.  Do not use heat therapy on areas of your skin that are already irritated, such as with a rash or sunburn. SEEK MEDICAL CARE IF:  You have blisters, redness, swelling, or numbness.  You have new pain.  Your pain is worse. MAKE SURE YOU:  Understand these instructions.  Will watch your condition.  Will get help right away if you are not doing well or get worse.   This information is not intended to replace advice given to you by your health care provider. Make sure you discuss any questions you have with your health care provider.   Document Released: 07/09/2011 Document Revised: 05/07/2014 Document Reviewed: 06/09/2013 Elsevier Interactive Patient Education 2016 [employer]  Muscle Cramps and Spasms Muscle cramps and spasms occur when a muscle or muscles tighten and you have no control over this tightening (involuntary muscle contraction). They are a common problem and can develop in any muscle. The most common place is in the calf muscles of the leg. Both muscle cramps and muscle spasms are involuntary muscle contractions, but they also have differences:   Muscle cramps are sporadic and painful. They may last a few seconds to a quarter of an hour. Muscle cramps are often more forceful and last longer than muscle spasms.  Muscle spasms may or may not be painful. They may also last just a few seconds or much longer. CAUSES  It is uncommon for cramps or spasms to be due to a serious underlying  problem. In many cases, the cause of cramps or spasms is unknown. Some common causes are:   Overexertion.   Overuse from repetitive motions (doing the same thing over and over).   Remaining in a certain position for a long period of time.   Improper preparation, form, or technique while performing a sport or activity.   Dehydration.   Injury.   Side effects of some medicines.   Abnormally low levels of the salts and ions in your blood (electrolytes), especially potassium and calcium. This could happen if you are taking water pills (diuretics) or you  are pregnant.  Some underlying medical problems can make it more likely to develop cramps or spasms. These include, but are not limited to:   Diabetes.   Parkinson disease.   Hormone disorders, such as thyroid problems.   Alcohol abuse.   Diseases specific to muscles, joints, and bones.   Blood vessel disease where not enough blood is getting to the muscles.  HOME CARE INSTRUCTIONS   Stay well hydrated. Drink enough water and fluids to keep your urine clear or pale yellow.  It may be helpful to massage, stretch, and relax the affected muscle.  For tight or tense muscles, use a warm towel, heating pad, or hot shower water directed to the affected area.  If you are sore or have pain after a cramp or spasm, applying ice to the affected area may relieve discomfort.  Put ice in a plastic bag.  Place a towel between your skin and the bag.  Leave the ice on for 15-20 minutes, 03-04 times a day.  Medicines used to treat a known cause of cramps or spasms may help reduce their frequency or severity. Only take over-the-counter or prescription medicines as directed by your caregiver. SEEK MEDICAL CARE IF:  Your cramps or spasms get more severe, more frequent, or do not improve over time.  MAKE SURE YOU:   Understand these instructions.  Will watch your condition.  Will get help right away if you are not doing well  or get worse.   This information is not intended to replace advice given to you by your health care provider. Make sure you discuss any questions you have with your health care provider.   Document Released: 10/06/2001 Document Revised: 08/11/2012 Document Reviewed: 04/02/2012 [employer] Interactive Patient Education [employer].

## 2015-03-02 NOTE — ED Provider Notes (Signed)
CSN: [id number]     Arrival date & time 03/02/15  1451 History  By signing my name below, I, Stephania Fragmin, attest that this documentation has been prepared under the direction and in the presence of Hewitt Garner, PA-C. Electronically Signed: Stephania Fragmin, ED Scribe. 03/02/2015. 3:52 PM.    Chief Complaint  Patient presents with  . Neck Pain   The history is provided by the patient. No language interpreter was used.    HPI Comments: Tammy Jacobs is a 44 y.o. female who presents to the Emergency Department complaining of atraumatic, constant, gradually worsening, left-sided neck pain that began 5 days ago. She states she had lifted heavy boxes at work the day before her pain onset, and she woke up with pain in her neck the next day. The pain is described as a soreness and does not radiate. She had applied OTC Biofreeze and Aspercreme topical analgesics with mild relief. Palpation and leaning her head to the right makes her pain worse. Denies numbness, tingling or weakness in her arms. She denies fever, chills, headache, vision changes, sore throat, nausea, vomiting, or abdominal pain.  Past Medical History  Diagnosis Date  . Asthma    Past Surgical History  Procedure Laterality Date  . Tubal ligation    . Cesarean section     History reviewed. No pertinent family history. Social History  Substance Use Topics  . Smoking status: Former Smoker    Types: Cigarettes  . Smokeless tobacco: None     Comment: quit 1 month ago  . Alcohol Use: No     Comment: ocasionally   OB History    No data available     Review of Systems  Constitutional: Negative for fever and chills.  HENT: Negative for sore throat.   Eyes: Negative for visual disturbance.  Gastrointestinal: Negative for nausea, vomiting and abdominal pain.  Musculoskeletal: Positive for neck pain and neck stiffness. Negative for myalgias, back pain, joint swelling and arthralgias.  Skin: Negative for rash.  Neurological: Negative  for weakness, numbness and headaches.    Allergies  Hydrocodone; Naproxen sodium; Percocet; Shellfish allergy; and Zolpidem  Home Medications   Prior to Admission medications   Medication Sig Start Date End Date Taking? Authorizing Provider  albuterol (PROVENTIL HFA;VENTOLIN HFA) 108 (90 BASE) MCG/ACT inhaler Inhale 2 puffs into the lungs every 6 (six) hours as needed for wheezing. 02/08/14   Tresa Garter, MD  amitriptyline (ELAVIL) 50 MG tablet Take 1 tablet (50 mg total) by mouth at bedtime as needed for sleep or pain. 11/19/13   Tresa Garter, MD  aspirin EC 81 MG tablet Take 162 mg by mouth daily as needed for pain.    Historical Provider, MD  cyclobenzaprine (FLEXERIL) 10 MG tablet Take 1 tablet (10 mg total) by mouth 3 (three) times daily as needed for muscle spasms. 02/08/14   Tresa Garter, MD  diphenhydrAMINE (BENADRYL) 25 mg capsule Take 1 capsule (25 mg total) by mouth every 6 (six) hours as needed. Patient taking differently: Take 25 mg by mouth every 6 (six) hours as needed for allergies.  02/08/14   Tresa Garter, MD  ibuprofen (ADVIL,MOTRIN) 600 MG tablet Take 1 tablet (600 mg total) by mouth every 6 (six) hours as needed. 03/08/14   Heather Laisure, PA-C  methocarbamol (ROBAXIN) 500 MG tablet Take 1 tablet (500 mg total) by mouth 2 (two) times daily. 03/02/15   Nazanin Kinner, PA-C  montelukast (SINGULAIR) 10 MG tablet Take  1 tablet (10 mg total) by mouth at bedtime. 04/09/13   Ripudeep Krystal Eaton, MD  ondansetron (ZOFRAN-ODT) 4 MG disintegrating tablet Take 1 tablet (4 mg total) by mouth every 8 (eight) hours as needed for nausea or vomiting. 04/20/14   Tresa Garter, MD  pantoprazole (PROTONIX) 40 MG tablet Take 1 tablet (40 mg total) by mouth daily. 11/19/13   Tresa Garter, MD  predniSONE (DELTASONE) 20 MG tablet Take 1 tablet (20 mg total) by mouth daily with breakfast. 02/08/14   Tresa Garter, MD  traMADol (ULTRAM) 50 MG tablet Take 1  tablet (50 mg total) by mouth every 6 (six) hours as needed for moderate pain. 02/08/14   Tresa Garter, MD   BP 117/71 mmHg  Pulse 93  Temp(Src) 98 F (36.7 C)  Resp 18  SpO2 98%  LMP 02/02/2015 Physical Exam  Constitutional: She is oriented to person, place, and time. She appears well-developed and well-nourished. No distress.  HENT:  Head: Normocephalic and atraumatic.  Eyes: Conjunctivae and EOM are normal.  Neck: Normal range of motion. Neck supple. Muscular tenderness present. No spinous process tenderness present. No tracheal deviation and normal range of motion present.    FROM of neck with mild pain with rotation. TTP over left lateral neck extending into shoulder. Muscle spasm of upper trapezius. No edema or skin changes over the neck.   Cardiovascular: Normal rate.   Pulmonary/Chest: Effort normal. No respiratory distress.  Musculoskeletal: Normal range of motion. She exhibits no edema or tenderness.  FROM of shoulder without exacerbating pain in the neck. No TTP over shoulder. No edema or obvious deformity  Lymphadenopathy:    She has no cervical adenopathy.  Neurological: She is alert and oriented to person, place, and time.  5/5 strength in BUE. Sensation to light touch intact  Skin: Skin is warm and dry. No rash noted. She is not diaphoretic. No erythema.  No erythema, lesions or other skin changes over neck  Psychiatric: She has a normal mood and affect. Her behavior is normal.  Nursing note and vitals reviewed.   ED Course  Procedures (including critical care time)  DIAGNOSTIC STUDIES: Oxygen Saturation is 98% on RA, normal by my interpretation.    COORDINATION OF CARE: 3:52 PM - Discussed treatment plan with pt at bedside which includes muscle relaxants, anti-inflammatories, or Tylenol for neck pain. Pt cautioned against sedating side effects of muscle relaxants. Will give PCP referral for f/u. Pt verbalized understanding and agreed to plan.   MDM    Final diagnoses:  Muscle spasm  Neck pain   Pt presenting with neck pain x 4 days. Pain is located over left lateral neck and extends towards shoulder. Pt reports lifting and moving heavy boxes before pain began. No associated symptoms. Pain worsened with touch and movement. On exam, muscular tenderness over left lateral neck and towards shoulder. Muscle spasm felt in distribution of upper trapezius. No skin changes over tender region. Will treat muscle spasm with muscle relaxer and anti-inflammatory. Discussed light duty at work and avoiding strenuous activity that may exacerbate the neck pain. Pt does not have PCP, will provide resource guide for follow up if symptoms persist. At this time there does not appear to be any evidence of an acute emergency medical condition and the patient appears stable for discharge with appropriate outpatient follow up. Diagnosis was discussed with patient who verbalizes understanding and is agreeable to discharge. Return precautions given in discharge paperwork and discussed with pt  at bedside. Pt stable for discharge   I personally performed the services described in this documentation, which was scribed in my presence. The recorded information has been reviewed and is accurate.     Josephina Gip, PA-C 03/02/15 1924  Dorie Rank, MD 03/04/15 1226

## 2015-03-02 NOTE — ED Notes (Addendum)
Pt here with neck pain since Saturday. sts she lifted some heavy boxes at work last Thursday. sts worse with moving her neck and sleeping.

## 2015-05-25 ENCOUNTER — Other Ambulatory Visit: Payer: Self-pay | Admitting: Internal Medicine

## 2016-04-03 ENCOUNTER — Emergency Department (HOSPITAL_COMMUNITY)
Admission: EM | Admit: 2016-04-03 | Discharge: 2016-04-03 | Disposition: A | Discharge status: Home / Self Care | Payer: Self-pay | Attending: Emergency Medicine | Admitting: Emergency Medicine

## 2016-04-03 ENCOUNTER — Encounter (HOSPITAL_COMMUNITY): Payer: Self-pay | Admitting: Emergency Medicine

## 2016-04-03 ENCOUNTER — Emergency Department (HOSPITAL_COMMUNITY): Payer: Self-pay

## 2016-04-03 DIAGNOSIS — Z87891 Personal history of nicotine dependence: Secondary | ICD-10-CM | POA: Insufficient documentation

## 2016-04-03 DIAGNOSIS — J4521 Mild intermittent asthma with (acute) exacerbation: Secondary | ICD-10-CM | POA: Insufficient documentation

## 2016-04-03 DIAGNOSIS — Z7982 Long term (current) use of aspirin: Secondary | ICD-10-CM | POA: Insufficient documentation

## 2016-04-03 LAB — CBC
HCT: 34.8 % — ABNORMAL LOW (ref 36.0–46.0)
Hemoglobin: 11.7 g/dL — ABNORMAL LOW (ref 12.0–15.0)
MCH: 31.5 pg (ref 26.0–34.0)
MCHC: 33.6 g/dL (ref 30.0–36.0)
MCV: 93.8 fL (ref 78.0–100.0)
PLATELETS: 312 10*3/uL (ref 150–400)
RBC: 3.71 MIL/uL — AB (ref 3.87–5.11)
RDW: 12.7 % (ref 11.5–15.5)
WBC: 7.9 10*3/uL (ref 4.0–10.5)

## 2016-04-03 LAB — BASIC METABOLIC PANEL
Anion gap: 6 (ref 5–15)
BUN: 17 mg/dL (ref 6–20)
CHLORIDE: 105 mmol/L (ref 101–111)
CO2: 26 mmol/L (ref 22–32)
CREATININE: 0.64 mg/dL (ref 0.44–1.00)
Calcium: 8.9 mg/dL (ref 8.9–10.3)
GFR calc Af Amer: >60 mL/min (ref >60)
GFR calc non Af Amer: >60 mL/min (ref >60)
GLUCOSE: 94 mg/dL (ref 65–99)
Potassium: 3.7 mmol/L (ref 3.5–5.1)
Sodium: 137 mmol/L (ref 135–145)

## 2016-04-03 LAB — I-STAT TROPONIN, ED: Troponin i, poc: 0 ng/mL (ref 0.00–0.08)

## 2016-04-03 MED ORDER — ALBUTEROL SULFATE (2.5 MG/3ML) 0.083% IN NEBU
5.0000 mg | INHALATION_SOLUTION | Freq: Once | RESPIRATORY_TRACT | Status: AC
  Administered 2016-04-03: 5 mg via RESPIRATORY_TRACT
  Filled 2016-04-03: qty 6

## 2016-04-03 MED ORDER — DEXAMETHASONE 4 MG PO TABS
10.0000 mg | ORAL_TABLET | Freq: Once | ORAL | Status: AC
  Administered 2016-04-03: 10 mg via ORAL
  Filled 2016-04-03: qty 3

## 2016-04-03 MED ORDER — ALBUTEROL SULFATE HFA 108 (90 BASE) MCG/ACT IN AERS
2.0000 | INHALATION_SPRAY | Freq: Once | RESPIRATORY_TRACT | Status: AC
Start: 2016-04-03 — End: 2016-04-03
  Administered 2016-04-03: 2 via RESPIRATORY_TRACT
  Filled 2016-04-03: qty 6.7

## 2016-04-03 MED ORDER — ACETAMINOPHEN 500 MG PO TABS
1000.0000 mg | ORAL_TABLET | Freq: Once | ORAL | Status: AC
  Administered 2016-04-03: 1000 mg via ORAL
  Filled 2016-04-03: qty 2

## 2016-04-03 MED ORDER — AEROCHAMBER PLUS FLO-VU MEDIUM MISC
1.0000 | Freq: Once | Status: AC
  Administered 2016-04-03: 1
  Filled 2016-04-03: qty 1

## 2016-04-03 NOTE — ED Notes (Signed)
ED Provider at bedside. 

## 2016-04-03 NOTE — ED Triage Notes (Signed)
Pt arrives from home reporting SOB with nonproductive cough x 3 days.  Pt reports L sided CP x 2 days, denies lightheadedness, dizziness.  Resp e/u, wheezes audible.
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
## 2016-07-20 ENCOUNTER — Encounter (HOSPITAL_COMMUNITY): Payer: Self-pay | Admitting: *Deleted

## 2016-07-20 ENCOUNTER — Emergency Department (HOSPITAL_COMMUNITY)
Admission: EM | Admit: 2016-07-20 | Discharge: 2016-07-20 | Disposition: A | Discharge status: Home / Self Care | Payer: No Typology Code available for payment source | Attending: Emergency Medicine | Admitting: Emergency Medicine

## 2016-07-20 DIAGNOSIS — Z7982 Long term (current) use of aspirin: Secondary | ICD-10-CM | POA: Insufficient documentation

## 2016-07-20 DIAGNOSIS — Z79899 Other long term (current) drug therapy: Secondary | ICD-10-CM | POA: Insufficient documentation

## 2016-07-20 DIAGNOSIS — Z87891 Personal history of nicotine dependence: Secondary | ICD-10-CM | POA: Insufficient documentation

## 2016-07-20 DIAGNOSIS — M25561 Pain in right knee: Secondary | ICD-10-CM | POA: Insufficient documentation

## 2016-07-20 DIAGNOSIS — J45909 Unspecified asthma, uncomplicated: Secondary | ICD-10-CM | POA: Insufficient documentation

## 2016-07-20 MED ORDER — LIDOCAINE 5 % EX PTCH: 1.0000 | MEDICATED_PATCH | CUTANEOUS | 0 refills | Status: DC

## 2016-07-20 NOTE — ED Triage Notes (Signed)
Pt complains of right knee pain for more than a month. Pt states pain is worse with movement.
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
## 2016-07-20 NOTE — ED Notes (Signed)
Discharge instructions, follow up care, and rx x1 reviewed with patient. Patient verbalized understanding.

## 2016-07-20 NOTE — Discharge Instructions (Signed)
Please follow up with orthopedist for further evaluation of your recurrent R knee pain.  You may use lido-derm patch for pain as needed.

## 2017-09-17 ENCOUNTER — Emergency Department (HOSPITAL_COMMUNITY)
Admission: EM | Admit: 2017-09-17 | Discharge: 2017-09-18 | Disposition: A | Discharge status: Home / Self Care | Payer: Self-pay | Attending: Emergency Medicine | Admitting: Emergency Medicine

## 2017-09-17 ENCOUNTER — Encounter (HOSPITAL_COMMUNITY): Payer: Self-pay | Admitting: Nurse Practitioner

## 2017-09-17 ENCOUNTER — Emergency Department (HOSPITAL_COMMUNITY): Payer: Self-pay

## 2017-09-17 ENCOUNTER — Other Ambulatory Visit: Payer: Self-pay

## 2017-09-17 DIAGNOSIS — M549 Dorsalgia, unspecified: Secondary | ICD-10-CM | POA: Insufficient documentation

## 2017-09-17 DIAGNOSIS — J45909 Unspecified asthma, uncomplicated: Secondary | ICD-10-CM | POA: Insufficient documentation

## 2017-09-17 DIAGNOSIS — R911 Solitary pulmonary nodule: Secondary | ICD-10-CM | POA: Insufficient documentation

## 2017-09-17 DIAGNOSIS — R0789 Other chest pain: Secondary | ICD-10-CM | POA: Insufficient documentation

## 2017-09-17 DIAGNOSIS — Z87891 Personal history of nicotine dependence: Secondary | ICD-10-CM | POA: Insufficient documentation

## 2017-09-17 DIAGNOSIS — J4521 Mild intermittent asthma with (acute) exacerbation: Secondary | ICD-10-CM | POA: Insufficient documentation

## 2017-09-17 LAB — BASIC METABOLIC PANEL
Anion gap: 10 (ref 5–15)
BUN: 14 mg/dL (ref 6–20)
CO2: 22 mmol/L (ref 22–32)
Calcium: 8.3 mg/dL — ABNORMAL LOW (ref 8.9–10.3)
Chloride: 106 mmol/L (ref 101–111)
Creatinine, Ser: 0.55 mg/dL (ref 0.44–1.00)
GFR calc Af Amer: >60 mL/min (ref >60)
GFR calc non Af Amer: >60 mL/min (ref >60)
Glucose, Bld: 86 mg/dL (ref 65–99)
Potassium: 3.3 mmol/L — ABNORMAL LOW (ref 3.5–5.1)
Sodium: 138 mmol/L (ref 135–145)

## 2017-09-17 LAB — CBC
HEMATOCRIT: 32.9 % — AB (ref 36.0–46.0)
Hemoglobin: 10.9 g/dL — ABNORMAL LOW (ref 12.0–15.0)
MCH: 29.8 pg (ref 26.0–34.0)
MCHC: 33.1 g/dL (ref 30.0–36.0)
MCV: 89.9 fL (ref 78.0–100.0)
PLATELETS: 327 10*3/uL (ref 150–400)
RBC: 3.66 MIL/uL — ABNORMAL LOW (ref 3.87–5.11)
RDW: 13.8 % (ref 11.5–15.5)
WBC: 7.8 10*3/uL (ref 4.0–10.5)

## 2017-09-17 LAB — I-STAT BETA HCG BLOOD, ED (MC, WL, AP ONLY): I-stat hCG, quantitative: <5 m[IU]/mL (ref <5)

## 2017-09-17 LAB — I-STAT TROPONIN, ED: Troponin i, poc: 0 ng/mL (ref 0.00–0.08)

## 2017-09-17 MED ORDER — MORPHINE SULFATE (PF) 4 MG/ML IV SOLN
4.0000 mg | Freq: Once | INTRAVENOUS | Status: AC
  Administered 2017-09-18: 4 mg via INTRAVENOUS
  Filled 2017-09-17: qty 1

## 2017-09-17 NOTE — ED Triage Notes (Signed)
Patient has had a cough for about a month. Yesterday she started having SOB and left sided chest and back pain. States it hurts to take a deep breath in and her left arm has been sore. Patient states she does have Asthma and it has been bad this month.

## 2017-09-17 NOTE — ED Notes (Signed)
Blood draw attempt x2 unsuccessful.  RN notified 
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
## 2017-09-17 NOTE — ED Notes (Signed)
BP placed too loosely on patient. BP cuff readjusted and rechecked.

## 2017-09-18 ENCOUNTER — Encounter (HOSPITAL_COMMUNITY): Payer: Self-pay

## 2017-09-18 ENCOUNTER — Emergency Department (HOSPITAL_COMMUNITY): Payer: Self-pay

## 2017-09-18 LAB — I-STAT TROPONIN, ED: TROPONIN I, POC: 0 ng/mL (ref 0.00–0.08)

## 2017-09-18 LAB — D-DIMER, QUANTITATIVE: D-Dimer, Quant: 0.55 ug/mL-FEU — ABNORMAL HIGH (ref 0.00–0.50)

## 2017-09-18 MED ORDER — IOPAMIDOL (ISOVUE-370) INJECTION 76%
INTRAVENOUS | Status: AC
  Filled 2017-09-18: qty 100

## 2017-09-18 MED ORDER — PREDNISONE 10 MG (21) PO TBPK: ORAL_TABLET | Freq: Every day | ORAL | 0 refills | Status: DC

## 2017-09-18 MED ORDER — ALBUTEROL SULFATE HFA 108 (90 BASE) MCG/ACT IN AERS
2.0000 | INHALATION_SPRAY | RESPIRATORY_TRACT | Status: DC | PRN
  Filled 2017-09-18: qty 6.7

## 2017-09-18 MED ORDER — SODIUM CHLORIDE 0.9 % IV BOLUS
1000.0000 mL | Freq: Once | INTRAVENOUS | Status: AC
  Administered 2017-09-18: 1000 mL via INTRAVENOUS

## 2017-09-18 MED ORDER — IOPAMIDOL (ISOVUE-370) INJECTION 76%
100.0000 mL | Freq: Once | INTRAVENOUS | Status: AC | PRN
  Administered 2017-09-18: 100 mL via INTRAVENOUS

## 2017-09-18 MED ORDER — AEROCHAMBER PLUS FLO-VU MEDIUM MISC
1.0000 | Freq: Once | Status: AC
  Administered 2017-09-18: 1
  Filled 2017-09-18: qty 1

## 2017-09-18 MED ORDER — ALBUTEROL SULFATE HFA 108 (90 BASE) MCG/ACT IN AERS: 2.0000 | INHALATION_SPRAY | RESPIRATORY_TRACT | 0 refills | Status: AC | PRN

## 2017-09-18 MED ORDER — IPRATROPIUM-ALBUTEROL 0.5-2.5 (3) MG/3ML IN SOLN
3.0000 mL | Freq: Once | RESPIRATORY_TRACT | Status: AC
  Administered 2017-09-18: 3 mL via RESPIRATORY_TRACT
  Filled 2017-09-18: qty 3

## 2017-09-18 NOTE — Discharge Instructions (Addendum)
Please call the [facility] or your primary care provider for an appointment.  If possible please be seen in the next 1 to 2 days.  If your chest pain worsens, becomes associated with shortness of breath that you cannot fix with breathing treatments, you develop nausea, vomiting, fevers, or any other concerns then please seek additional medical care and evaluation.  You need follow up for a lung nodule.

## 2017-09-18 NOTE — ED Notes (Signed)
Patient has minimal visible or palpable veins. Previous attempts have reduced site options. PA Wyn Quaker aware of the situation and suggested IV team since patient has chest pain symptoms.

## 2017-09-18 NOTE — ED Notes (Signed)
Sharyn Lull (friend) [phone]

## 2017-12-29 ENCOUNTER — Other Ambulatory Visit: Payer: Self-pay

## 2017-12-29 ENCOUNTER — Emergency Department (HOSPITAL_COMMUNITY)
Admission: EM | Admit: 2017-12-29 | Discharge: 2017-12-29 | Disposition: A | Discharge status: Home / Self Care | Payer: Self-pay | Attending: Emergency Medicine | Admitting: Emergency Medicine

## 2017-12-29 ENCOUNTER — Encounter (HOSPITAL_COMMUNITY): Payer: Self-pay | Admitting: Emergency Medicine

## 2017-12-29 ENCOUNTER — Emergency Department (HOSPITAL_COMMUNITY): Payer: Self-pay

## 2017-12-29 DIAGNOSIS — M79672 Pain in left foot: Secondary | ICD-10-CM | POA: Insufficient documentation

## 2017-12-29 DIAGNOSIS — Z79899 Other long term (current) drug therapy: Secondary | ICD-10-CM | POA: Insufficient documentation

## 2017-12-29 DIAGNOSIS — J45909 Unspecified asthma, uncomplicated: Secondary | ICD-10-CM | POA: Insufficient documentation

## 2017-12-29 DIAGNOSIS — Z87891 Personal history of nicotine dependence: Secondary | ICD-10-CM | POA: Insufficient documentation

## 2017-12-29 DIAGNOSIS — Z7982 Long term (current) use of aspirin: Secondary | ICD-10-CM | POA: Insufficient documentation

## 2017-12-29 NOTE — Discharge Instructions (Signed)
Please rest, ice, elevate the foot, especially if you are on your feet for a long time Please follow up with podiatry if you are not improving Try inserts to take the pressure off of the forefoot Continue Ibuprofen 600mg  three times daily for pain and inflammation

## 2017-12-29 NOTE — ED Notes (Signed)
Pt ready for discharge VS documented
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
## 2017-12-29 NOTE — ED Triage Notes (Signed)
Pt reports L foot pain X few days. Denies injury. No swelling, redness, bruising noted. Ambulatory.

## 2018-03-06 DIAGNOSIS — N938 Other specified abnormal uterine and vaginal bleeding: Secondary | ICD-10-CM | POA: Insufficient documentation

## 2018-03-07 DIAGNOSIS — F32A Depression, unspecified: Secondary | ICD-10-CM | POA: Insufficient documentation

## 2018-03-07 DIAGNOSIS — F411 Generalized anxiety disorder: Secondary | ICD-10-CM | POA: Insufficient documentation

## 2018-06-24 ENCOUNTER — Other Ambulatory Visit: Payer: Self-pay

## 2018-06-24 ENCOUNTER — Emergency Department
Admission: EM | Admit: 2018-06-24 | Discharge: 2018-06-24 | Disposition: A | Discharge status: Home / Self Care | Payer: PRIVATE HEALTH INSURANCE | Attending: Emergency Medicine | Admitting: Emergency Medicine

## 2018-06-24 ENCOUNTER — Encounter: Payer: Self-pay | Admitting: Emergency Medicine

## 2018-06-24 DIAGNOSIS — Z87891 Personal history of nicotine dependence: Secondary | ICD-10-CM | POA: Diagnosis not present

## 2018-06-24 DIAGNOSIS — Z7982 Long term (current) use of aspirin: Secondary | ICD-10-CM | POA: Diagnosis not present

## 2018-06-24 DIAGNOSIS — R51 Headache: Secondary | ICD-10-CM | POA: Diagnosis present

## 2018-06-24 DIAGNOSIS — J4521 Mild intermittent asthma with (acute) exacerbation: Secondary | ICD-10-CM | POA: Diagnosis not present

## 2018-06-24 DIAGNOSIS — J111 Influenza due to unidentified influenza virus with other respiratory manifestations: Secondary | ICD-10-CM | POA: Diagnosis not present

## 2018-06-24 MED ORDER — ACETAMINOPHEN 325 MG PO TABS
650.0000 mg | ORAL_TABLET | Freq: Once | ORAL | Status: AC | PRN
  Administered 2018-06-24: 650 mg via ORAL
  Filled 2018-06-24: qty 2

## 2018-06-24 MED ORDER — ALBUTEROL SULFATE (2.5 MG/3ML) 0.083% IN NEBU
5.0000 mg | INHALATION_SOLUTION | Freq: Once | RESPIRATORY_TRACT | Status: AC
  Administered 2018-06-24: 5 mg via RESPIRATORY_TRACT
  Filled 2018-06-24: qty 6

## 2018-06-24 MED ORDER — METHYLPREDNISOLONE SODIUM SUCC 125 MG IJ SOLR
125.0000 mg | Freq: Once | INTRAMUSCULAR | Status: AC
  Administered 2018-06-24: 125 mg via INTRAMUSCULAR
  Filled 2018-06-24: qty 2

## 2018-06-24 MED ORDER — PREDNISONE 50 MG PO TABS: 50.0000 mg | ORAL_TABLET | Freq: Every day | ORAL | 0 refills | Status: DC

## 2018-06-24 NOTE — ED Notes (Signed)
See triage note  Presents with cough and body aches for 1-2 days     Febrile on arrival
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
## 2018-06-24 NOTE — ED Triage Notes (Addendum)
Pt c/o generalized body aches, cough , headache and fever of 102. Pt states family at home has been sick.

## 2018-07-03 DIAGNOSIS — D500 Iron deficiency anemia secondary to blood loss (chronic): Secondary | ICD-10-CM | POA: Insufficient documentation

## 2019-04-14 ENCOUNTER — Other Ambulatory Visit: Payer: Self-pay | Admitting: Family Medicine

## 2019-04-14 DIAGNOSIS — R911 Solitary pulmonary nodule: Secondary | ICD-10-CM

## 2019-05-07 ENCOUNTER — Ambulatory Visit
Admission: RE | Admit: 2019-05-07 | Discharge: 2019-05-07 | Disposition: A | Discharge status: Home / Self Care | Payer: PRIVATE HEALTH INSURANCE | Source: Ambulatory Visit | Attending: Family Medicine | Admitting: Family Medicine

## 2019-05-07 DIAGNOSIS — R911 Solitary pulmonary nodule: Secondary | ICD-10-CM

## 2019-05-07 MED ORDER — IOPAMIDOL (ISOVUE-300) INJECTION 61%
75.0000 mL | Freq: Once | INTRAVENOUS | Status: AC | PRN
  Administered 2019-05-07: 75 mL via INTRAVENOUS

## 2019-05-26 DIAGNOSIS — Z8616 Personal history of COVID-19: Secondary | ICD-10-CM | POA: Insufficient documentation

## 2019-07-06 DIAGNOSIS — C3490 Malignant neoplasm of unspecified part of unspecified bronchus or lung: Secondary | ICD-10-CM | POA: Insufficient documentation

## 2019-08-31 DIAGNOSIS — E669 Obesity, unspecified: Secondary | ICD-10-CM | POA: Insufficient documentation

## 2019-09-25 ENCOUNTER — Encounter: Payer: Self-pay | Admitting: Family Medicine

## 2020-09-08 ENCOUNTER — Other Ambulatory Visit: Payer: Self-pay | Admitting: Family Medicine

## 2020-09-08 DIAGNOSIS — Z1231 Encounter for screening mammogram for malignant neoplasm of breast: Secondary | ICD-10-CM

## 2020-09-18 IMAGING — CT CT CHEST W/ CM
2 of 4 series · 15 of 36 positions shown, 18 images · IV contrast (iopamidol)
Comparison: 09/18/2017 chest CT angiogram.

CLINICAL DATA: Follow-up pulmonary nodule.  Nonsmoker.

EXAM:
CT CHEST WITH CONTRAST
TECHNIQUE: Multidetector CT imaging of the chest was performed during
intravenous contrast administration.
CONTRAST:  75mL 0JFRHD-THH IOPAMIDOL (0JFRHD-THH) INJECTION 61%

[Series 2: chest 2.00 br40 s3 · axial · 0.78mm/px · z∈[+1314,+1578]mm · 12 of 156 slices shown, 15 images (1 of 2)]
[im 12/156  mediastinal]
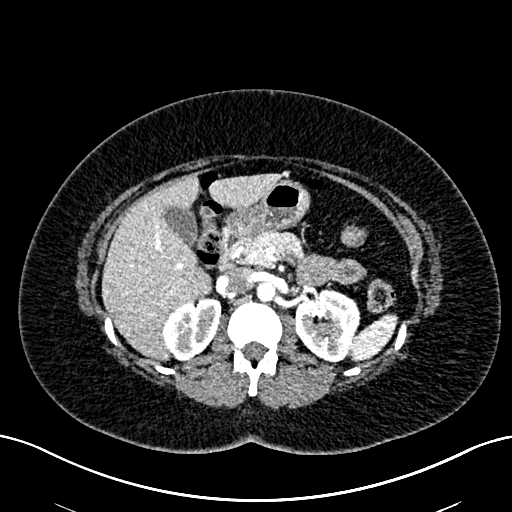
[im 12/156  lung]
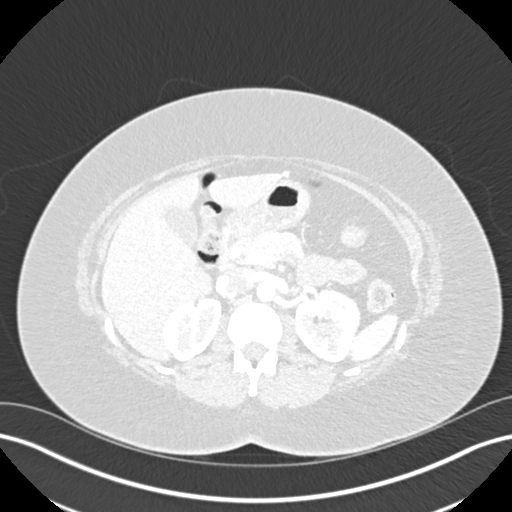
[im 23/156  lung]
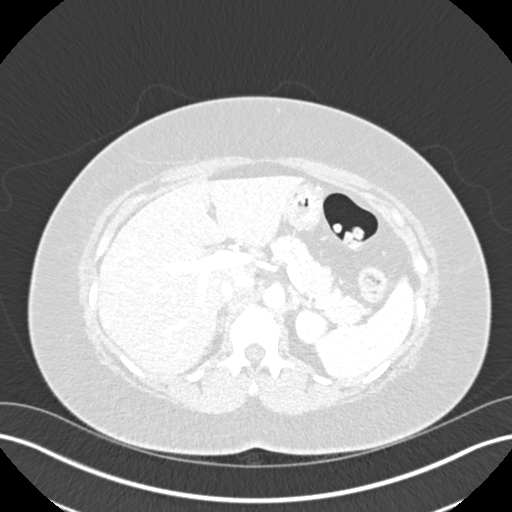
[im 34/156  lung]
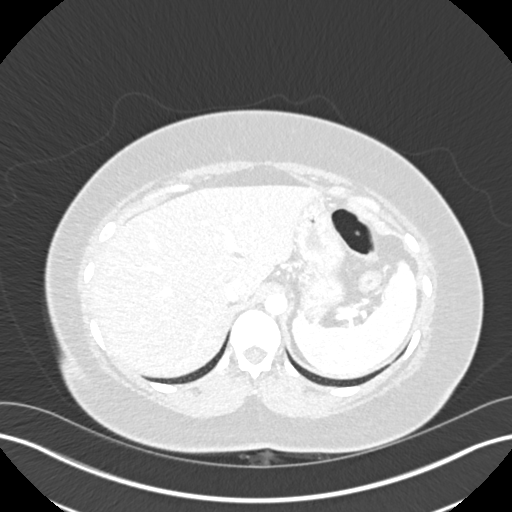
[im 45/156  lung]
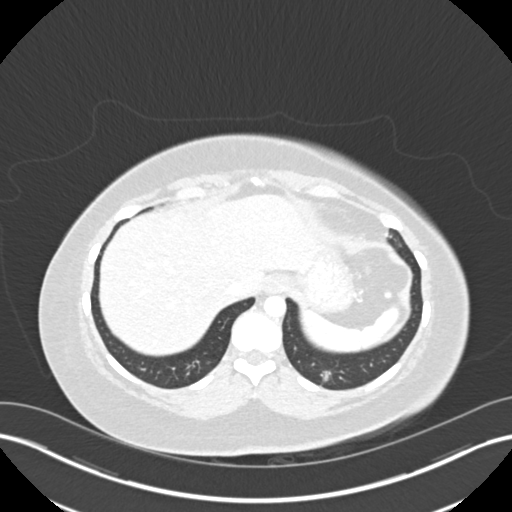
[im 56/156  mediastinal]
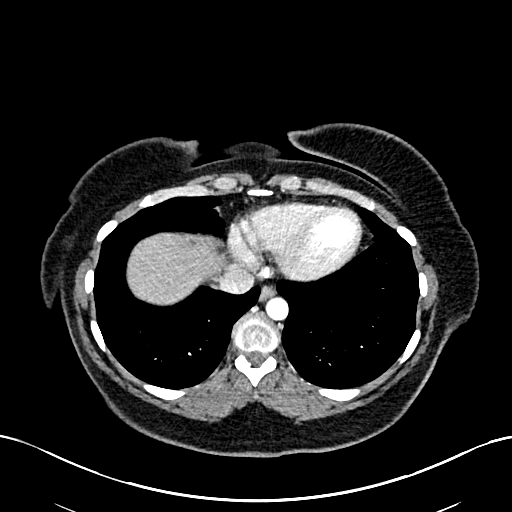
[im 56/156  lung]
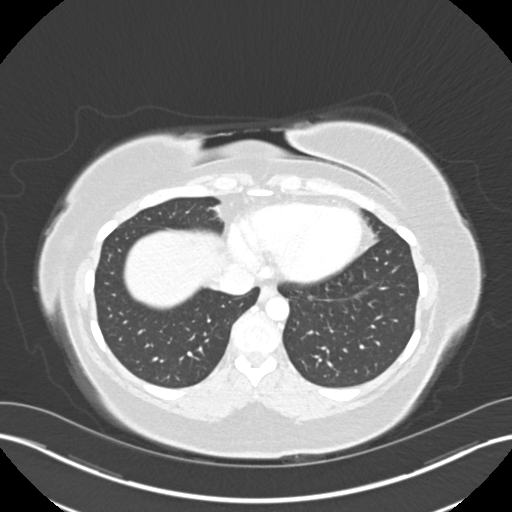
[im 67/156  lung]
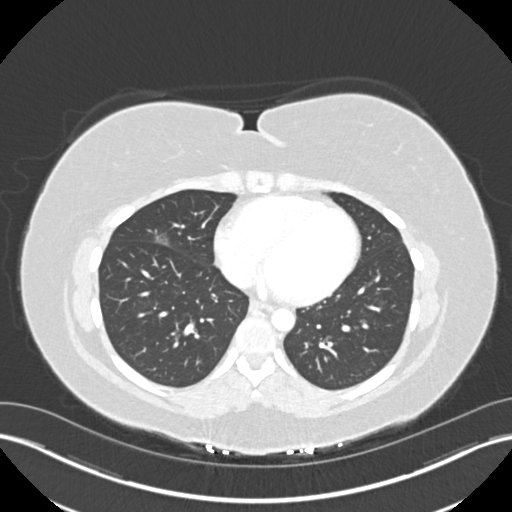
[im 89/156  lung]
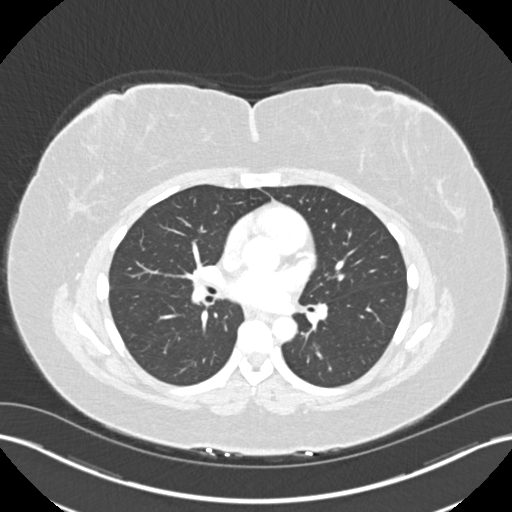
[im 100/156  lung]
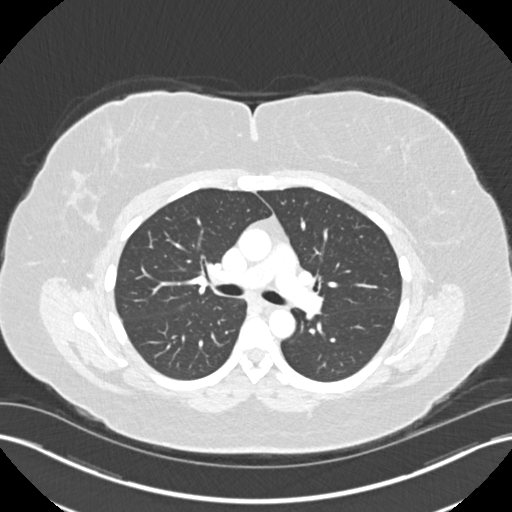
[im 111/156  mediastinal]
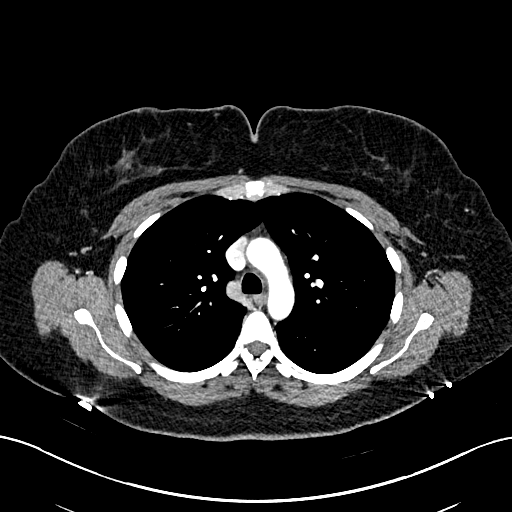
[im 111/156  lung]
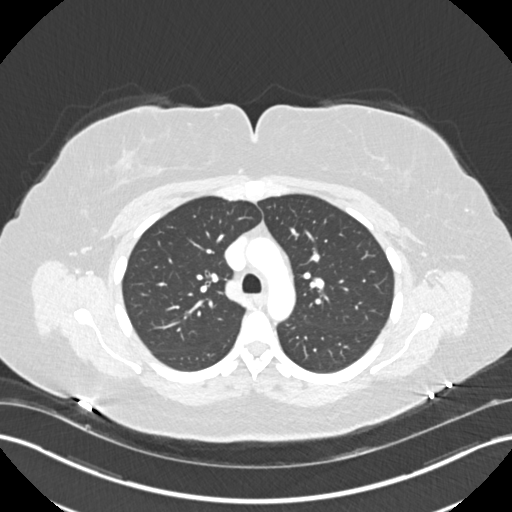
[im 122/156  lung]
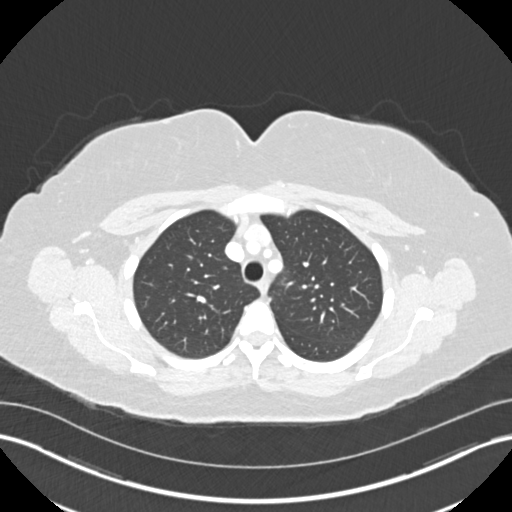
[im 133/156  lung]
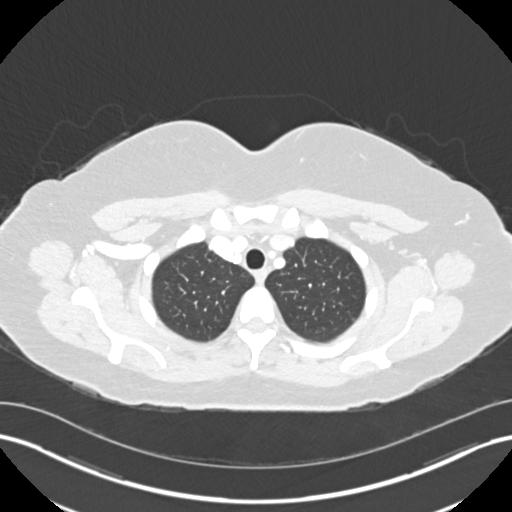
[im 144/156  lung]
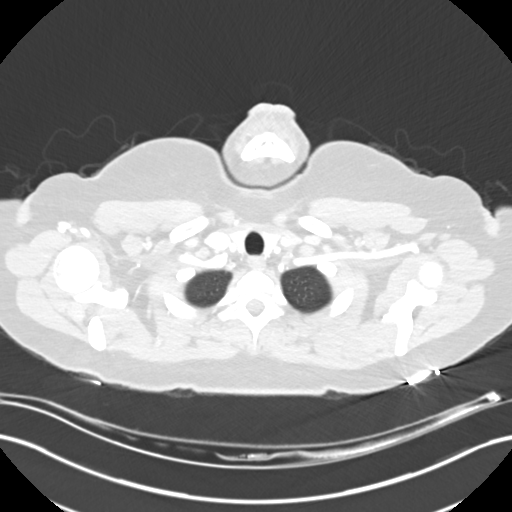

[Series 4: chest 2.00 br40 s3 · coronal · 0.61mm/px · 3 of 200 slices shown (2 of 2)]
[im 40/200  lung]
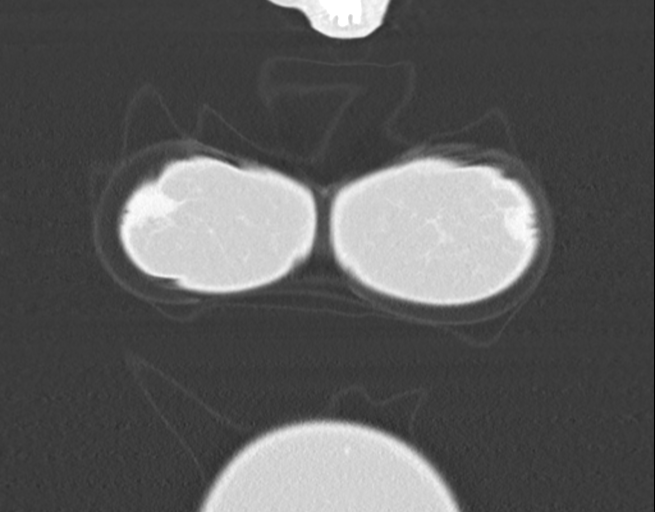
[im 80/200  lung]
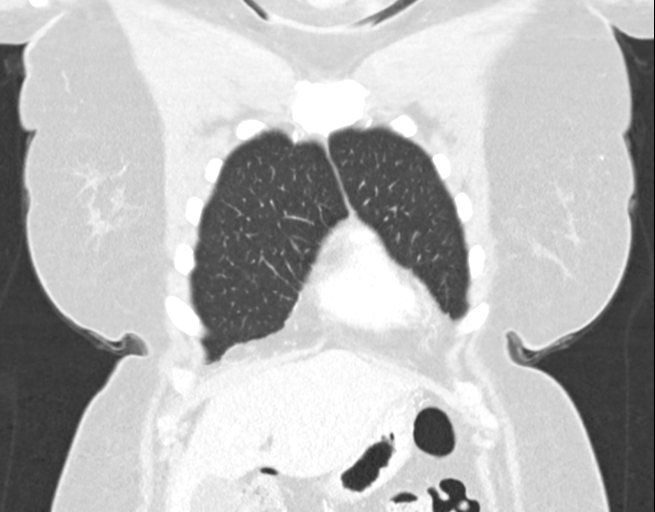
[im 120/200  lung]
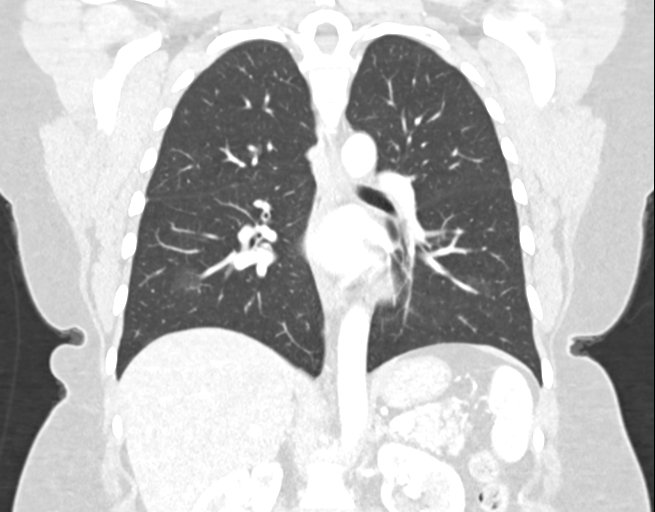

[15 of 36 positions shown; findings below may reference images not displayed]

FINDINGS: Cardiovascular: Normal heart size. No significant pericardial
effusion/thickening. Great vessels are normal in course and caliber.
No central pulmonary emboli.

Mediastinum/Nodes: No discrete thyroid nodules. Unremarkable
esophagus. No pathologically enlarged axillary, mediastinal or hilar
lymph nodes.

Lungs/Pleura: No pneumothorax. No pleural effusion. Irregular solid
1.3 x 1.1 cm posterior basilar left lower lobe pulmonary nodule
(series 8/image 116) abutting the posterior pleural surface,
increased from 1.0 x 1.0 cm on 09/18/2017 chest CT. No acute
consolidative airspace disease, lung masses or additional
significant pulmonary nodules. Faint patchy ground-glass opacity in
the mid to lower lungs bilaterally, right greater than left, new.

Upper abdomen: No acute abnormality.

Musculoskeletal:  No aggressive appearing focal osseous lesions.
IMPRESSION: 1. New faint patchy ground-glass opacity in the mid to lower lungs
bilaterally, right greater than left. Differential is broad and
includes atypical/viral pneumonia including [id number].
2. Irregular solid 1.3 x 1.1 cm posterior basilar left lower lobe
pulmonary nodule abutting the posterior pleural surface, increased
since 09/18/2017 chest CT study. Primary bronchogenic malignancy
cannot be excluded. PET-CT recommended for further characterization.
3. No thoracic adenopathy.

These results will be called to the ordering clinician or
representative by the Radiologist Assistant, and communication
documented in the PACS or zVision Dashboard.

## 2021-01-30 ENCOUNTER — Other Ambulatory Visit: Payer: Self-pay

## 2021-01-30 ENCOUNTER — Telehealth (INDEPENDENT_AMBULATORY_CARE_PROVIDER_SITE_OTHER): Payer: PRIVATE HEALTH INSURANCE | Admitting: Psychiatry

## 2021-01-30 DIAGNOSIS — Z7689 Persons encountering health services in other specified circumstances: Secondary | ICD-10-CM

## 2021-01-30 NOTE — Progress Notes (Signed)
Received message from front desk that patient had contacted to reschedule this appointment.

## 2021-02-27 ENCOUNTER — Encounter: Payer: Self-pay | Admitting: Psychiatry

## 2021-02-27 ENCOUNTER — Other Ambulatory Visit: Payer: Self-pay

## 2021-02-27 ENCOUNTER — Ambulatory Visit (INDEPENDENT_AMBULATORY_CARE_PROVIDER_SITE_OTHER): Payer: PRIVATE HEALTH INSURANCE | Admitting: Psychiatry

## 2021-02-27 VITALS — BP 138/87 | HR 88 | Ht 60.0 in | Wt 192.0 lb

## 2021-02-27 DIAGNOSIS — F331 Major depressive disorder, recurrent, moderate: Secondary | ICD-10-CM | POA: Diagnosis not present

## 2021-02-27 DIAGNOSIS — F419 Anxiety disorder, unspecified: Secondary | ICD-10-CM | POA: Diagnosis not present

## 2021-02-27 DIAGNOSIS — G4700 Insomnia, unspecified: Secondary | ICD-10-CM

## 2021-02-27 MED ORDER — NORTRIPTYLINE HCL 10 MG PO CAPS: 10.0000 mg | ORAL_CAPSULE | Freq: Every day | ORAL | 0 refills | Status: DC

## 2021-02-27 NOTE — Patient Instructions (Signed)
Nortriptyline capsules What is this medication? NORTRIPTYLINE (nor TRIP ti leen) is used to treat depression. This medicine may be used for other purposes; ask your health care provider or pharmacist if you have questions. COMMON BRAND NAME(S): Aventyl, Pamelor What should I tell my care team before I take this medication? They need to know if you have any of these conditions: bipolar disorder Brugada syndrome difficulty passing urine glaucoma heart disease if you drink alcohol liver disease schizophrenia seizures suicidal thoughts, plans or attempt; a previous suicide attempt by you or a family member thyroid disease an unusual or allergic reaction to nortriptyline, other tricyclic antidepressants, other medicines, foods, dyes, or preservatives pregnant or trying to get pregnant breast-feeding How should I use this medication? Take this medicine by mouth with a glass of water. Follow the directions on the prescription label. Take your doses at regular intervals. Do not take it more often than directed. Do not stop taking this medicine suddenly except upon the advice of your doctor. Stopping this medicine too quickly may cause serious side effects or your condition may worsen. A special MedGuide will be given to you by the pharmacist with each prescription and refill. Be sure to read this information carefully each time. Talk to your pediatrician regarding the use of this medicine in children. Special care may be needed. Overdosage: If you think you have taken too much of this medicine contact a poison control center or emergency room at once. NOTE: This medicine is only for you. Do not share this medicine with others. What if I miss a dose? If you miss a dose, take it as soon as you can. If it is almost time for your next dose, take only that dose. Do not take double or extra doses. What may interact with this medication? Do not take this medicine with any of the following  medications: cisapride dronedarone linezolid MAOIs like Carbex, Eldepryl, Marplan, Nardil, and Parnate methylene blue (injected into a vein) pimozide thioridazine This medicine may also interact with the following medications: alcohol antihistamines for allergy, cough, and cold atropine certain medicines for bladder problems like oxybutynin, tolterodine certain medicines for depression like amitriptyline, fluoxetine, sertraline certain medicines for Parkinson's disease like benztropine, trihexyphenidyl certain medicines for stomach problems like dicyclomine, hyoscyamine certain medicines for travel sickness like scopolamine chlorpropamide cimetidine ipratropium other medicines that prolong the QT interval (an abnormal heart rhythm) like dofetilide other medicines that can cause serotonin syndrome like St. John's Wort, fentanyl, lithium, tramadol, tryptophan, buspirone, and some medicines for headaches like sumatriptan or rizatriptan quinidine reserpine thyroid medicine This list may not describe all possible interactions. Give your health care provider a list of all the medicines, herbs, non-prescription drugs, or dietary supplements you use. Also tell them if you smoke, drink alcohol, or use illegal drugs. Some items may interact with your medicine. What should I watch for while using this medication? Tell your doctor if your symptoms do not get better or if they get worse. Visit your doctor or health care professional for regular checks on your progress. Because it may take several weeks to see the full effects of this medicine, it is important to continue your treatment as prescribed by your doctor. Patients and their families should watch out for new or worsening thoughts of suicide or depression. Also watch out for sudden changes in feelings such as feeling anxious, agitated, panicky, irritable, hostile, aggressive, impulsive, severely restless, overly excited and hyperactive, or not  being able to sleep. If this happens,  especially at the beginning of treatment or after a change in dose, call your health care professional. Dennis Bast may get drowsy or dizzy. Do not drive, use machinery, or do anything that needs mental alertness until you know how this medicine affects you. Do not stand or sit up quickly, especially if you are an older patient. This reduces the risk of dizzy or fainting spells. Alcohol may interfere with the effect of this medicine. Avoid alcoholic drinks. Do not treat yourself for coughs, colds, or allergies without asking your doctor or health care professional for advice. Some ingredients can increase possible side effects. Your mouth may get dry. Chewing sugarless gum or sucking hard candy, and drinking plenty of water may help. Contact your doctor if the problem does not go away or is severe. This medicine may cause dry eyes and blurred vision. If you wear contact lenses you may feel some discomfort. Lubricating drops may help. See your eye doctor if the problem does not go away or is severe. This medicine can cause constipation. Try to have a bowel movement at least every 2 to 3 days. If you do not have a bowel movement for 3 days, call your doctor or health care professional. This medicine can make you more sensitive to the sun. Keep out of the sun. If you cannot avoid being in the sun, wear protective clothing and use sunscreen. Do not use sun lamps or tanning beds/booths. What side effects may I notice from receiving this medication? Side effects that you should report to your doctor or health care professional as soon as possible: allergic reactions like skin rash, itching or hives, swelling of the face, lips, or tongue anxious breathing problems changes in vision confusion elevated mood, decreased need for sleep, racing thoughts, impulsive behavior eye pain fast, irregular heartbeat feeling faint or lightheaded, falls feeling agitated, angry, or  irritable fever with increased sweating hallucination, loss of contact with reality seizures stiff muscles suicidal thoughts or other mood changes tingling, pain, or numbness in the feet or hands trouble passing urine or change in the amount of urine trouble sleeping unusually weak or tired vomiting yellowing of the eyes or skin Side effects that usually do not require medical attention (report to your doctor or health care professional if they continue or are bothersome): change in sex drive or performance change in appetite or weight constipation dizziness dry mouth nausea tired tremors upset stomach This list may not describe all possible side effects. Call your doctor for medical advice about side effects. You may report side effects to FDA at 1-800-FDA-1088. Where should I keep my medication? Keep out of the reach of children. Store at room temperature between 15 and 30 degrees C (59 and 86 degrees F). Keep container tightly closed. Throw away any unused medicine after the expiration date. NOTE: This sheet is a summary. It may not cover all possible information. If you have questions about this medicine, talk to your doctor, pharmacist, or health care provider.  2022 Elsevier/Gold Standard (2019-12-01 15:25:34)
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
## 2021-03-10 LAB — TSH: TSH: 0.49 u[IU]/mL (ref 0.450–4.500)

## 2021-03-13 ENCOUNTER — Encounter: Payer: Self-pay | Admitting: Psychiatry

## 2021-03-13 ENCOUNTER — Other Ambulatory Visit: Payer: Self-pay

## 2021-03-13 ENCOUNTER — Ambulatory Visit (INDEPENDENT_AMBULATORY_CARE_PROVIDER_SITE_OTHER): Payer: PRIVATE HEALTH INSURANCE | Admitting: Psychiatry

## 2021-03-13 VITALS — BP 156/89 | HR 85 | Temp 98.1°F | Wt 190.6 lb

## 2021-03-13 DIAGNOSIS — F419 Anxiety disorder, unspecified: Secondary | ICD-10-CM

## 2021-03-13 DIAGNOSIS — G4700 Insomnia, unspecified: Secondary | ICD-10-CM

## 2021-03-13 DIAGNOSIS — F331 Major depressive disorder, recurrent, moderate: Secondary | ICD-10-CM

## 2021-03-13 MED ORDER — NORTRIPTYLINE HCL 10 MG PO CAPS: 30.0000 mg | ORAL_CAPSULE | Freq: Every day | ORAL | 1 refills | Status: AC

## 2021-03-13 NOTE — Progress Notes (Signed)
[facility] MD OP Progress Note  03/13/2021 5:00 PM Tammy Jacobs  MRN:  [id number]  Chief Complaint:  Chief Complaint   Follow-up; Anxiety; Depression    HPI: Tammy Jacobs is a 50 year old African-American female, divorced, employed, lives in [place], lives with a roommate, has a history of MDD, hysterectomy, anxiety disorder unspecified, insomnia unspecified, solitary pulmonary nodule with left lung lobectomy was evaluated in office today.  Patient today reports her sleep problems are improving.  She tried taking a 20 mg of the nortriptyline a couple of nights ago and that helps her better to sleep through the night.  She currently sleeps around 6 hours on the 20 mg.  Denies side effects.  Patient continues to be on the venlafaxine, reports mood symptoms as improving.  Patient denies any suicidality, homicidality or perceptual disturbances.  Patient denies any other concerns today.  Visit Diagnosis:    ICD-10-CM   1. MDD (major depressive disorder), recurrent episode, moderate (HCC)  F33.1 nortriptyline (PAMELOR) 10 MG capsule    2. Anxiety disorder, unspecified type  F41.9     3. Insomnia, unspecified type  G47.00 nortriptyline (PAMELOR) 10 MG capsule      Past Psychiatric History: I have reviewed past psychiatric history from progress note on 02/27/2021.  Past trials of Prozac, BuSpar, Abilify, Cymbalta, hydroxyzine, venlafaxine  Past Medical History:  Past Medical History:  Diagnosis Date   Asthma    Pulmonary nodule, left     Past Surgical History:  Procedure Laterality Date   CESAREAN SECTION     left lung lobectomy Left    TUBAL LIGATION      Family Psychiatric History: Reviewed family psychiatric history from progress note on 02/27/2021  Family History:  Family History  Problem Relation Age of Onset   Schizophrenia Maternal Uncle    Schizophrenia Maternal Grandfather     Social History: Reviewed social history from progress note on 02/27/2021 Social  History   Socioeconomic History   Marital status: Divorced    Spouse name: Not on file   Number of children: Not on file   Years of education: Not on file   Highest education level: Not on file  Occupational History   Not on file  Tobacco Use   Smoking status: Former    Types: Cigarettes   Smokeless tobacco: Former   Tobacco comments:    Quit 2 years ago  Substance and Sexual Activity   Alcohol use: Not Currently    Comment: Socially   Drug use: No   Sexual activity: Yes  Other Topics Concern   Not on file  Social History Narrative   Not on file   Social Determinants of Health   Financial Resource Strain: Not on file  Food Insecurity: Not on file  Transportation Needs: Not on file  Physical Activity: Not on file  Stress: Not on file  Social Connections: Not on file    Allergies:  Allergies  Allergen Reactions   Hydrocodone Shortness Of Breath   Morphine And Related Anaphylaxis   Naproxen Sodium Shortness Of Breath   Percocet [Oxycodone-Acetaminophen] Shortness Of Breath   Shellfish Allergy Anaphylaxis   Zolpidem Hives   Other Other (See Comments)    States skin staples and other metals (watch) have bothered her, caused generalized pain, and ED visit    Metabolic Disorder Labs: No results found for: HGBA1C, MPG No results found for: PROLACTIN No results found for: CHOL, TRIG, HDL, CHOLHDL, VLDL, LDLCALC Lab Results  Component Value Date  TSH 0.490 03/09/2021   TSH 1.035 01/01/2013    Therapeutic Level Labs: No results found for: LITHIUM No results found for: VALPROATE No components found for:  CBMZ  Current Medications: Current Outpatient Medications  Medication Sig Dispense Refill   albuterol (PROVENTIL HFA;VENTOLIN HFA) 108 (90 Base) MCG/ACT inhaler Inhale 2 puffs into the lungs every 4 (four) hours as needed for wheezing or shortness of breath. 1 Inhaler 0   aspirin EC 81 MG tablet Take 162 mg by mouth once.     clotrimazole-betamethasone  (LOTRISONE) cream Apply topically.     cyanocobalamin 1000 MCG tablet Take 5,000 mcg by mouth daily.     desonide (DESOWEN) 0.05 % cream Apply topically 3 (three) times daily as needed.     EPINEPHrine 0.3 mg/0.3 mL IJ SOAJ injection Inject into the muscle.     estradiol (ESTRACE) 0.5 MG tablet Take by mouth.     hydrOXYzine (VISTARIL) 25 MG capsule Take 25-50 mg by mouth at bedtime as needed.     montelukast (SINGULAIR) 10 MG tablet Take by mouth.     nortriptyline (PAMELOR) 10 MG capsule Take 3 capsules (30 mg total) by mouth at bedtime. 90 capsule 1   OVER THE COUNTER MEDICATION Take 1 tablet by mouth as directed. 2 tablets in the evening and 1 tablet at bedtime     traMADol (ULTRAM) 50 MG tablet Take 1 tablet (50 mg total) by mouth every 6 (six) hours as needed for moderate pain. 60 tablet 2   venlafaxine (EFFEXOR) 75 MG tablet Take 1 tablet by mouth daily.     No current facility-administered medications for this visit.     Musculoskeletal: Strength & Muscle Tone: within normal limits Gait & Station: normal Patient leans: N/A  Psychiatric Specialty Exam: Review of Systems  Psychiatric/Behavioral:  Positive for dysphoric mood and sleep disturbance. The patient is nervous/anxious.   All other systems reviewed and are negative.  Blood pressure (!) 156/89, pulse 85, temperature 98.1 F (36.7 C), temperature source Temporal, weight 190 lb 9.6 oz (86.5 kg).Body mass index is 37.22 kg/m.  General Appearance: Casual  Eye Contact:  Fair  Speech:  Clear and Coherent  Volume:  Normal  Mood:  Anxious and Depressed  Affect:  Congruent  Thought Process:  Goal Directed and Descriptions of Associations: Intact  Orientation:  Full (Time, Place, and Person)  Thought Content: Logical   Suicidal Thoughts:  No  Homicidal Thoughts:  No  Memory:  Immediate;   Fair Recent;   Fair Remote;   Fair  Judgement:  Fair  Insight:  Fair  Psychomotor Activity:  Normal  Concentration:  Concentration:  Fair and Attention Span: Fair  Recall:  [employer] of Knowledge: Fair  Language: Fair  Akathisia:  No  Handed:  Right  AIMS (if indicated): done  Assets:  Communication Skills Desire for Improvement Housing Social Support  ADL's:  Intact  Cognition: WNL  Sleep:   improving   Screenings: GAD-7    Flowsheet Row Office Visit from 02/27/2021 in [place]  Total GAD-7 Score 10      PHQ2-9    [place] Visit from 03/13/2021 in [place] Office Visit from 02/27/2021 in [place]  PHQ-2 Total Score 4 5  PHQ-9 Total Score 12 [place] Office Visit from 02/27/2021 in [place] Moderate Risk  Assessment and Plan: Tammy Jacobs is a 50 year old African-American female, divorced, lives in [place], employed, has a history of depression, anxiety, insomnia, hysterectomy, menopausal syndrome, history of left lung lobectomy was evaluated in office today.  Patient with some improvement in her sleep, will benefit from dosage readjustment of the nortriptyline.  Plan as noted below.  Plan MDD-unstable Venlafaxine 75 mg p.o. daily Nortriptyline as prescribed PHQ-9-improved-12 Patient has been referred for CBT.  Patient has her first appointment coming up.  Anxiety disorder unspecified-rule out GAD-improving Venlafaxine 75 mg daily  Hydroxyzine 25-50 mg p.o. nightly as needed for anxiety and sleep Patient has been referred for CBT  Insomnia-unstable Increase Pamelor to 30 mg p.o. nightly Continue hydroxyzine at bedtime as needed for sleep and anxiety   Reviewed and discussed labs-TSH-within normal limits  Follow-up in clinic in 3 weeks or sooner in person.  This note was generated in part or whole with voice recognition software. Voice recognition is usually quite accurate but there are transcription  errors that can and very often do occur. I apologize for any typographical errors that were not detected and corrected.    Ursula Alert, MD 03/14/2021, 8:41 AM

## 2021-03-15 ENCOUNTER — Other Ambulatory Visit: Payer: Self-pay | Admitting: Family Medicine

## 2021-03-15 DIAGNOSIS — Z1231 Encounter for screening mammogram for malignant neoplasm of breast: Secondary | ICD-10-CM

## 2021-04-06 ENCOUNTER — Encounter: Payer: Self-pay | Admitting: Licensed Clinical Social Worker

## 2021-04-06 ENCOUNTER — Ambulatory Visit (INDEPENDENT_AMBULATORY_CARE_PROVIDER_SITE_OTHER): Payer: PRIVATE HEALTH INSURANCE | Admitting: Licensed Clinical Social Worker

## 2021-04-06 ENCOUNTER — Other Ambulatory Visit: Payer: Self-pay

## 2021-04-06 DIAGNOSIS — F419 Anxiety disorder, unspecified: Secondary | ICD-10-CM | POA: Diagnosis not present

## 2021-04-06 DIAGNOSIS — F331 Major depressive disorder, recurrent, moderate: Secondary | ICD-10-CM | POA: Diagnosis not present

## 2021-04-06 NOTE — Plan of Care (Signed)
Developed tx plan with pt input
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
## 2021-04-17 ENCOUNTER — Ambulatory Visit: Payer: PRIVATE HEALTH INSURANCE | Admitting: Psychiatry

## 2021-06-01 ENCOUNTER — Ambulatory Visit: Payer: PRIVATE HEALTH INSURANCE | Admitting: Psychiatry

## 2021-06-01 ENCOUNTER — Ambulatory Visit: Payer: PRIVATE HEALTH INSURANCE | Admitting: Licensed Clinical Social Worker

## 2022-01-17 ENCOUNTER — Other Ambulatory Visit: Payer: Self-pay

## 2022-01-17 ENCOUNTER — Encounter (HOSPITAL_BASED_OUTPATIENT_CLINIC_OR_DEPARTMENT_OTHER): Payer: Self-pay | Admitting: Emergency Medicine

## 2022-01-17 DIAGNOSIS — Z7982 Long term (current) use of aspirin: Secondary | ICD-10-CM | POA: Diagnosis not present

## 2022-01-17 DIAGNOSIS — S6992XA Unspecified injury of left wrist, hand and finger(s), initial encounter: Secondary | ICD-10-CM | POA: Diagnosis present

## 2022-01-17 DIAGNOSIS — Z7951 Long term (current) use of inhaled steroids: Secondary | ICD-10-CM | POA: Diagnosis not present

## 2022-01-17 DIAGNOSIS — J45909 Unspecified asthma, uncomplicated: Secondary | ICD-10-CM | POA: Diagnosis not present

## 2022-01-17 DIAGNOSIS — W260XXA Contact with knife, initial encounter: Secondary | ICD-10-CM | POA: Insufficient documentation

## 2022-01-17 DIAGNOSIS — Y9389 Activity, other specified: Secondary | ICD-10-CM | POA: Insufficient documentation

## 2022-01-17 DIAGNOSIS — Z79899 Other long term (current) drug therapy: Secondary | ICD-10-CM | POA: Insufficient documentation

## 2022-01-17 DIAGNOSIS — S61412A Laceration without foreign body of left hand, initial encounter: Secondary | ICD-10-CM | POA: Diagnosis not present
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
## 2022-01-18 ENCOUNTER — Emergency Department (HOSPITAL_BASED_OUTPATIENT_CLINIC_OR_DEPARTMENT_OTHER): Payer: PRIVATE HEALTH INSURANCE | Admitting: Radiology

## 2022-01-18 ENCOUNTER — Encounter (HOSPITAL_BASED_OUTPATIENT_CLINIC_OR_DEPARTMENT_OTHER): Payer: Self-pay | Admitting: Emergency Medicine

## 2022-01-18 ENCOUNTER — Emergency Department (HOSPITAL_BASED_OUTPATIENT_CLINIC_OR_DEPARTMENT_OTHER)
Admission: EM | Admit: 2022-01-18 | Discharge: 2022-01-18 | Disposition: A | Discharge status: Home / Self Care | Payer: PRIVATE HEALTH INSURANCE | Attending: Emergency Medicine | Admitting: Emergency Medicine

## 2022-01-18 DIAGNOSIS — S61412A Laceration without foreign body of left hand, initial encounter: Secondary | ICD-10-CM

## 2022-01-18 NOTE — Discharge Instructions (Signed)
Keep wound clean, dry, and covered.  Return to the emergency department if you develop redness surrounding the wound, pus draining from the wound, worsening pain, streaks up or down the arm, or for other new and concerning symptoms.
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
## 2022-11-23 ENCOUNTER — Ambulatory Visit: Payer: PRIVATE HEALTH INSURANCE | Admitting: Nurse Practitioner

## 2022-11-23 ENCOUNTER — Telehealth: Payer: Self-pay | Admitting: Family Medicine

## 2022-11-23 NOTE — Telephone Encounter (Signed)
NS 7/26 called at last minute to cancel no reason given no letter sent pt blocked.

## 2023-01-22 ENCOUNTER — Other Ambulatory Visit: Payer: Self-pay

## 2023-01-22 ENCOUNTER — Emergency Department (HOSPITAL_COMMUNITY)
Admission: EM | Admit: 2023-01-22 | Discharge: 2023-01-22 | Disposition: A | Discharge status: Home / Self Care | Payer: No Typology Code available for payment source | Attending: Emergency Medicine | Admitting: Emergency Medicine

## 2023-01-22 ENCOUNTER — Emergency Department (HOSPITAL_COMMUNITY): Payer: No Typology Code available for payment source

## 2023-01-22 ENCOUNTER — Encounter (HOSPITAL_COMMUNITY): Payer: Self-pay

## 2023-01-22 DIAGNOSIS — M546 Pain in thoracic spine: Secondary | ICD-10-CM | POA: Insufficient documentation

## 2023-01-22 DIAGNOSIS — Z85118 Personal history of other malignant neoplasm of bronchus and lung: Secondary | ICD-10-CM | POA: Diagnosis not present

## 2023-01-22 DIAGNOSIS — R079 Chest pain, unspecified: Secondary | ICD-10-CM | POA: Diagnosis not present

## 2023-01-22 DIAGNOSIS — Z7982 Long term (current) use of aspirin: Secondary | ICD-10-CM | POA: Insufficient documentation

## 2023-01-22 DIAGNOSIS — M549 Dorsalgia, unspecified: Secondary | ICD-10-CM

## 2023-01-22 HISTORY — DX: Malignant (primary) neoplasm, unspecified: C80.1

## 2023-01-22 LAB — BASIC METABOLIC PANEL
Anion gap: 8 (ref 5–15)
BUN: 27 mg/dL — ABNORMAL HIGH (ref 6–20)
CO2: 20 mmol/L — ABNORMAL LOW (ref 22–32)
Calcium: 8.4 mg/dL — ABNORMAL LOW (ref 8.9–10.3)
Chloride: 108 mmol/L (ref 98–111)
Creatinine, Ser: 0.59 mg/dL (ref 0.44–1.00)
GFR, Estimated: >60 mL/min (ref >60)
Glucose, Bld: 102 mg/dL — ABNORMAL HIGH (ref 70–99)
Potassium: 4.5 mmol/L (ref 3.5–5.1)
Sodium: 136 mmol/L (ref 135–145)

## 2023-01-22 LAB — CBC
HCT: 35.4 % — ABNORMAL LOW (ref 36.0–46.0)
Hemoglobin: 11.8 g/dL — ABNORMAL LOW (ref 12.0–15.0)
MCH: 31.9 pg (ref 26.0–34.0)
MCHC: 33.3 g/dL (ref 30.0–36.0)
MCV: 95.7 fL (ref 80.0–100.0)
Platelets: 267 10*3/uL (ref 150–400)
RBC: 3.7 MIL/uL — ABNORMAL LOW (ref 3.87–5.11)
RDW: 12.1 % (ref 11.5–15.5)
WBC: 5.4 10*3/uL (ref 4.0–10.5)
nRBC: 0 % (ref 0.0–0.2)

## 2023-01-22 LAB — TROPONIN I (HIGH SENSITIVITY): Troponin I (High Sensitivity): <2 ng/L (ref <18)

## 2023-01-22 LAB — BRAIN NATRIURETIC PEPTIDE: B Natriuretic Peptide: 14.1 pg/mL (ref 0.0–100.0)

## 2023-01-22 MED ORDER — ACETAMINOPHEN 500 MG PO TABS
1000.0000 mg | ORAL_TABLET | Freq: Once | ORAL | Status: AC
  Administered 2023-01-22: 1000 mg via ORAL
  Filled 2023-01-22: qty 2

## 2023-01-22 MED ORDER — METHOCARBAMOL 500 MG PO TABS: 500.0000 mg | ORAL_TABLET | Freq: Every evening | ORAL | 0 refills | Status: AC | PRN

## 2023-01-22 MED ORDER — METHOCARBAMOL 500 MG PO TABS
500.0000 mg | ORAL_TABLET | Freq: Every evening | ORAL | 0 refills | Status: DC | PRN
  Filled 2023-01-22: qty 20, 20d supply, fill #0

## 2023-01-22 MED ORDER — LIDOCAINE 5 % EX PTCH
1.0000 | MEDICATED_PATCH | CUTANEOUS | Status: DC
  Administered 2023-01-22: 1 via TRANSDERMAL
  Filled 2023-01-22: qty 1

## 2023-01-22 MED ORDER — LIDOCAINE 5 % EX PTCH
1.0000 | MEDICATED_PATCH | CUTANEOUS | 0 refills | Status: DC
  Filled 2023-01-22: qty 30, 30d supply, fill #0

## 2023-01-22 MED ORDER — LIDOCAINE 5 % EX PTCH: 1.0000 | MEDICATED_PATCH | CUTANEOUS | 0 refills | Status: AC

## 2023-01-22 NOTE — ED Provider Notes (Signed)
[place] EMERGENCY DEPARTMENT AT [facility] Provider Note   CSN: [id number] Arrival date & time: 01/22/23  7829     History  Chief Complaint  Patient presents with   Chest Pain    Tammy Jacobs is a 52 y.o. female.  This is a 52 year old female who presents emergency department today due to upper back pain.  Patient says that last night when she was in bed, she noticed that when she would roll over she would have a very sharp pain in her upper back.  She says that she noticed this when she was rolling over in bed.  She says that she did feel this radiate to her chest.  She denies any shortness of breath.  Patient with past history of lung resection secondary to lung cancer.   Chest Pain      Home Medications Prior to Admission medications   Medication Sig Start Date End Date Taking? Authorizing Provider  lidocaine (LIDODERM) 5 % Place 1 patch onto the skin daily. Remove & Discard patch within 12 hours or as directed by MD 01/22/23  Yes Arletha Pili, DO  methocarbamol (ROBAXIN) 500 MG tablet Take 1 tablet (500 mg total) by mouth at bedtime as needed for muscle spasms (Back Pain). 01/22/23  Yes Anders Simmonds T, DO  albuterol (PROVENTIL HFA;VENTOLIN HFA) 108 (90 Base) MCG/ACT inhaler Inhale 2 puffs into the lungs every 4 (four) hours as needed for wheezing or shortness of breath. 09/18/17   Cristina Gong, PA-C  aspirin EC 81 MG tablet Take 162 mg by mouth once.    [provider]  clotrimazole-betamethasone (LOTRISONE) cream Apply topically. 12/21/20   [provider]  cyanocobalamin 1000 MCG tablet Take 5,000 mcg by mouth daily.    [provider]  desonide (DESOWEN) 0.05 % cream Apply topically 3 (three) times daily as needed. 09/01/20   [provider]  EPINEPHrine 0.3 mg/0.3 mL IJ SOAJ injection Inject into the muscle. 04/01/19   [provider]  estradiol (ESTRACE) 0.5 MG tablet Take by mouth. 12/15/20    [provider]  hydrOXYzine (VISTARIL) 25 MG capsule Take 25-50 mg by mouth at bedtime as needed. 02/10/21   [provider]  montelukast (SINGULAIR) 10 MG tablet Take by mouth. 08/31/19   [provider]  nortriptyline (PAMELOR) 10 MG capsule Take 3 capsules (30 mg total) by mouth at bedtime. 03/13/21   Jomarie Longs, MD  OVER THE COUNTER MEDICATION Take 1 tablet by mouth as directed. 2 tablets in the evening and 1 tablet at bedtime    [provider]  traMADol (ULTRAM) 50 MG tablet Take 1 tablet (50 mg total) by mouth every 6 (six) hours as needed for moderate pain. 02/08/14   Quentin Angst, MD  venlafaxine (EFFEXOR) 75 MG tablet Take 1 tablet by mouth daily. 01/11/21   [provider]      Allergies    Hydrocodone, Morphine and codeine, Naproxen sodium, Percocet [oxycodone-acetaminophen], Shellfish allergy, Zolpidem, and Other    Review of Systems   Review of Systems  Cardiovascular:  Positive for chest pain.    Physical Exam Updated Vital Signs BP (!) 139/95   Pulse 90   Temp 97.8 F (36.6 C) (Oral)   Resp 18   Ht 5' (1.524 m)   Wt 66.2 kg   LMP 06/17/2018   SpO2 100%   BMI 28.51 kg/m  Physical Exam Vitals reviewed.  Cardiovascular:     Rate and  Rhythm: Normal rate and regular rhythm.     Pulses:          Carotid pulses are 2+ on the right side and 2+ on the left side.      Radial pulses are 2+ on the right side and 2+ on the left side.       Dorsalis pedis pulses are 2+ on the right side and 2+ on the left side.       Posterior tibial pulses are 2+ on the right side and 2+ on the left side.     Heart sounds: Normal heart sounds.  Pulmonary:     Effort: Pulmonary effort is normal. No tachypnea.  Chest:     Chest wall: No mass or deformity.  Musculoskeletal:     Comments: Reproducible back pain medial to the left scapula.  Neurological:     Mental Status: She is alert.     ED Results / Procedures / Treatments    Labs (all labs ordered are listed, but only abnormal results are displayed) Labs Reviewed  BASIC METABOLIC PANEL - Abnormal; Notable for the following components:      Result Value   CO2 20 (*)    Glucose, Bld 102 (*)    BUN 27 (*)    Calcium 8.4 (*)    All other components within normal limits  CBC - Abnormal; Notable for the following components:   RBC 3.70 (*)    Hemoglobin 11.8 (*)    HCT 35.4 (*)    All other components within normal limits  BRAIN NATRIURETIC PEPTIDE  TROPONIN I (HIGH SENSITIVITY)  TROPONIN I (HIGH SENSITIVITY)    EKG EKG Interpretation Date/Time:  Tuesday January 22 2023 07:21:32 EDT Ventricular Rate:  84 PR Interval:  146 QRS Duration:  77 QT Interval:  343 QTC Calculation: 406 R Axis:   79  Text Interpretation: Sinus rhythm Consider left ventricular hypertrophy Confirmed by Anders Simmonds [phone]) on 01/22/2023 7:24:30 AM  Radiology DG Chest 2 View  Result Date: 01/22/2023 CLINICAL DATA:  chest pain EXAM: CHEST - 2 VIEW COMPARISON:  April 05, 2022, Sep 17, 2017 FINDINGS: The cardiomediastinal silhouette is normal in contour. No pleural effusion. No pneumothorax. No acute pleuroparenchymal abnormality. Visualized abdomen is unremarkable. No acute osseous abnormality noted. IMPRESSION: No acute cardiopulmonary abnormality. Electronically Signed   By: Meda Klinefelter M.D.   On: 01/22/2023 08:19    Procedures Procedures    Medications Ordered in ED Medications  lidocaine (LIDODERM) 5 % 1 patch (1 patch Transdermal Patch Applied 01/22/23 0752)  acetaminophen (TYLENOL) tablet 1,000 mg (1,000 mg Oral Given 01/22/23 6045)    ED Course/ Medical Decision Making/ A&P                                 Medical Decision Making This is a 52 year old female is here today with back pain.  Differential diagnoses include musculoskeletal pain, considered ACS, considered aortic dissection, considered PE.  Plan-I believe the patient's symptoms are most  likely musculoskeletal back pain.  She had reproducible pain on palpation along the medial border of the left scapula.  Will provide some analgesia and lidocaine patch.    My independent review of the patient's EKG shows no ST segment depression or elevation, no T wave inversions, no evidence of acute ischemia.  I considered aortic dissection in this patient given her report of back pain with radiation to the front, however  patient has normal vital signs, and her exam is inconsistent with dissection given the presence of reproducible musculoskeletal pain.  Reassessment-troponin negative.  BNP negative.  My independent review the patient's chest x-ray shows no pneumonia.  Patient's pain continues to be reproducible with movement.  Believe it is musculoskeletal.  Will discharge.  Precautions discussed with patient at bedside.   Amount and/or Complexity of Data Reviewed Labs: ordered. Radiology: ordered.  Risk OTC drugs. Prescription drug management.           Final Clinical Impression(s) / ED Diagnoses Final diagnoses:  Musculoskeletal back pain    Rx / DC Orders ED Discharge Orders          Ordered    lidocaine (LIDODERM) 5 %  Every 24 hours        01/22/23 0908    methocarbamol (ROBAXIN) 500 MG tablet  At bedtime PRN        01/22/23 0908              Anders Simmonds T, DO 01/22/23 [phone]

## 2023-01-22 NOTE — ED Notes (Signed)
Results discussed with patient by EDP provider. All questions answered. Discharge instructions, follow up information, and prescriptions reviewed with patient. Patient verbalized understanding. No acute distress noted. Escorted to ED lobby for dispo. Stable condition.

## 2023-01-22 NOTE — Discharge Instructions (Addendum)
You can take 1000 mg of Tylenol every 8 hours, 400 mg of ibuprofen every 6 hours.  You can take Robaxin at night, and apply lidocaine patches as needed.  You had test done today to look for signs of injury to your heart or lungs.  These tests were normal.  I think that your symptoms are most likely a back strain.  The symptoms should begin to get better over the next few days.  I would recommend taking the next 2 days off from work.

## 2023-01-22 NOTE — ED Triage Notes (Signed)
Patient stated having left sided chest pain that radiates down her left arm and left shoulder blade. Pain began last night. No nausea or vomiting.

## 2023-08-25 ENCOUNTER — Emergency Department (HOSPITAL_COMMUNITY): Payer: Self-pay

## 2023-08-25 ENCOUNTER — Emergency Department (HOSPITAL_COMMUNITY)
Admission: EM | Admit: 2023-08-25 | Discharge: 2023-08-26 | Disposition: A | Discharge status: Home / Self Care | Payer: Self-pay | Attending: Emergency Medicine | Admitting: Emergency Medicine

## 2023-08-25 ENCOUNTER — Other Ambulatory Visit: Payer: Self-pay

## 2023-08-25 ENCOUNTER — Encounter (HOSPITAL_COMMUNITY): Payer: Self-pay

## 2023-08-25 DIAGNOSIS — Z87891 Personal history of nicotine dependence: Secondary | ICD-10-CM | POA: Insufficient documentation

## 2023-08-25 DIAGNOSIS — J988 Other specified respiratory disorders: Secondary | ICD-10-CM | POA: Insufficient documentation

## 2023-08-25 DIAGNOSIS — Z7982 Long term (current) use of aspirin: Secondary | ICD-10-CM | POA: Insufficient documentation

## 2023-08-25 DIAGNOSIS — B9789 Other viral agents as the cause of diseases classified elsewhere: Secondary | ICD-10-CM | POA: Insufficient documentation

## 2023-08-25 LAB — CBC WITH DIFFERENTIAL/PLATELET
Abs Immature Granulocytes: 0.02 10*3/uL (ref 0.00–0.07)
Basophils Absolute: 0 10*3/uL (ref 0.0–0.1)
Basophils Relative: 0 %
Eosinophils Absolute: 0.1 10*3/uL (ref 0.0–0.5)
Eosinophils Relative: 2 %
HCT: 35.3 % — ABNORMAL LOW (ref 36.0–46.0)
Hemoglobin: 12 g/dL (ref 12.0–15.0)
Immature Granulocytes: 0 %
Lymphocytes Relative: 20 %
Lymphs Abs: 0.9 10*3/uL (ref 0.7–4.0)
MCH: 32.4 pg (ref 26.0–34.0)
MCHC: 34 g/dL (ref 30.0–36.0)
MCV: 95.4 fL (ref 80.0–100.0)
Monocytes Absolute: 0.4 10*3/uL (ref 0.1–1.0)
Monocytes Relative: 9 %
Neutro Abs: 3.2 10*3/uL (ref 1.7–7.7)
Neutrophils Relative %: 69 %
Platelets: 233 10*3/uL (ref 150–400)
RBC: 3.7 MIL/uL — ABNORMAL LOW (ref 3.87–5.11)
RDW: 12.6 % (ref 11.5–15.5)
WBC: 4.6 10*3/uL (ref 4.0–10.5)
nRBC: 0 % (ref 0.0–0.2)

## 2023-08-25 LAB — PROTIME-INR
INR: 0.9 (ref 0.8–1.2)
Prothrombin Time: 12.7 s (ref 11.4–15.2)

## 2023-08-25 LAB — TROPONIN I (HIGH SENSITIVITY): Troponin I (High Sensitivity): 3 ng/L (ref <18)

## 2023-08-25 LAB — RESP PANEL BY RT-PCR (RSV, FLU A&B, COVID)  RVPGX2
Influenza A by PCR: NEGATIVE
Influenza B by PCR: NEGATIVE
Resp Syncytial Virus by PCR: NEGATIVE
SARS Coronavirus 2 by RT PCR: NEGATIVE

## 2023-08-25 LAB — I-STAT CG4 LACTIC ACID, ED: Lactic Acid, Venous: 0.7 mmol/L (ref 0.5–1.9)

## 2023-08-25 MED ORDER — IBUPROFEN 800 MG PO TABS
800.0000 mg | ORAL_TABLET | Freq: Once | ORAL | Status: AC
  Administered 2023-08-25: 800 mg via ORAL
  Filled 2023-08-25: qty 1

## 2023-08-25 NOTE — ED Notes (Signed)
 Patient transported to X-ray

## 2023-08-25 NOTE — ED Provider Notes (Signed)
 [place] EMERGENCY DEPARTMENT AT [facility] Provider Note   CSN: [id number] Arrival date & time: 08/25/23  2152     History {Add pertinent medical, surgical, social history, OB history to HPI:1} Chief Complaint  Patient presents with   Chest Pain   Cough   Shortness of Breath    Tammy Jacobs is a 53 y.o. female.  Patient presents the emergency room complaint of cough and shortness of breath which began yesterday.  She endorses fever, headache, 1 episode of emesis.  Patient also endorses chest discomfort with coughing.  She denies abdominal pain, diarrhea, urinary symptoms.  Past medical history significant for GERD, chronic asthma, anxiety, chronic pain syndrome   Chest Pain Associated symptoms: cough and shortness of breath   Cough Associated symptoms: chest pain and shortness of breath   Shortness of Breath Associated symptoms: chest pain and cough        Home Medications Prior to Admission medications   Medication Sig Start Date End Date Taking? Authorizing Provider  albuterol  (PROVENTIL  HFA;VENTOLIN  HFA) 108 (90 Base) MCG/ACT inhaler Inhale 2 puffs into the lungs every 4 (four) hours as needed for wheezing or shortness of breath. 09/18/17   Dwain Giovanni, PA-C  aspirin  EC 81 MG tablet Take 162 mg by mouth once.    [provider]  clotrimazole-betamethasone (LOTRISONE) cream Apply topically. 12/21/20   [provider]  cyanocobalamin 1000 MCG tablet Take 5,000 mcg by mouth daily.    [provider]  desonide (DESOWEN) 0.05 % cream Apply topically 3 (three) times daily as needed. 09/01/20   [provider]  EPINEPHrine 0.3 mg/0.3 mL IJ SOAJ injection Inject into the muscle. 04/01/19   [provider]  estradiol (ESTRACE) 0.5 MG tablet Take by mouth. 12/15/20   [provider]  hydrOXYzine (VISTARIL) 25 MG capsule Take 25-50 mg by mouth at bedtime as needed. 02/10/21   [provider]   lidocaine  (LIDODERM ) 5 % Place 1 patch onto the skin daily. Remove & Discard patch within 12 hours or as directed by MD 01/22/23   Afton Horse T, DO  methocarbamol  (ROBAXIN ) 500 MG tablet Take 1 tablet (500 mg total) by mouth at bedtime as needed for muscle spasms (Back Pain). 01/22/23   Afton Horse T, DO  montelukast  (SINGULAIR ) 10 MG tablet Take by mouth. 08/31/19   [provider]  nortriptyline  (PAMELOR ) 10 MG capsule Take 3 capsules (30 mg total) by mouth at bedtime. 03/13/21   Eappen, Saramma, MD  OVER THE COUNTER MEDICATION Take 1 tablet by mouth as directed. 2 tablets in the evening and 1 tablet at bedtime    [provider]  traMADol  (ULTRAM ) 50 MG tablet Take 1 tablet (50 mg total) by mouth every 6 (six) hours as needed for moderate pain. 02/08/14   Jegede, Olugbemiga E, MD  venlafaxine (EFFEXOR) 75 MG tablet Take 1 tablet by mouth daily. 01/11/21   [provider]      Allergies    Hydrocodone , Morphine  and codeine, Naproxen sodium, Percocet [oxycodone-acetaminophen ], Shellfish allergy, Zolpidem, and Other    Review of Systems   Review of Systems  Respiratory:  Positive for cough and shortness of breath.   Cardiovascular:  Positive for chest pain.    Physical Exam Updated Vital Signs BP (!) 136/106   Pulse 69   Temp (!) 100.8 F (38.2 C) (Oral)   Resp 19   LMP 06/17/2018   SpO2 90%  Physical Exam Vitals and nursing  note reviewed.  Constitutional:      General: She is not in acute distress.    Appearance: She is well-developed.  HENT:     Head: Normocephalic and atraumatic.  Eyes:     Conjunctiva/sclera: Conjunctivae normal.  Cardiovascular:     Rate and Rhythm: Normal rate and regular rhythm.  Pulmonary:     Effort: Pulmonary effort is normal. No respiratory distress.     Breath sounds: Rhonchi present.  Chest:     Chest wall: No tenderness.  Abdominal:     Palpations: Abdomen is soft.     Tenderness: There is no abdominal  tenderness.  Musculoskeletal:        General: No swelling.     Cervical back: Neck supple.  Skin:    General: Skin is warm and dry.     Capillary Refill: Capillary refill takes less than 2 seconds.  Neurological:     Mental Status: She is alert.  Psychiatric:        Mood and Affect: Mood normal.     ED Results / Procedures / Treatments   Labs (all labs ordered are listed, but only abnormal results are displayed) Labs Reviewed  RESP PANEL BY RT-PCR (RSV, FLU A&B, COVID)  RVPGX2  CULTURE, BLOOD (ROUTINE X 2)  CULTURE, BLOOD (ROUTINE X 2)  COMPREHENSIVE METABOLIC PANEL WITH GFR  PROTIME-INR  CBC WITH DIFFERENTIAL/PLATELET  URINALYSIS, W/ REFLEX TO CULTURE (INFECTION SUSPECTED)  I-STAT CG4 LACTIC ACID, ED  TROPONIN I (HIGH SENSITIVITY)    EKG None  Radiology DG Chest 2 View Result Date: 08/25/2023 CLINICAL DATA:  Short of breath and chest pain, cough EXAM: CHEST - 2 VIEW COMPARISON:  01/22/2023 FINDINGS: Frontal and lateral views of the chest demonstrate an unremarkable cardiac silhouette. No acute airspace disease, effusion, or pneumothorax. No acute bony abnormalities. IMPRESSION: 1. No acute intrathoracic process. Electronically Signed   By: Bobbye Burrow M.D.   On: 08/25/2023 22:29    Procedures Procedures  {Document cardiac monitor, telemetry assessment procedure when appropriate:1}  Medications Ordered in ED Medications - No data to display  ED Course/ Medical Decision Making/ A&P   {   Click here for ABCD2, HEART and other calculatorsREFRESH Note before signing :1}                              Medical Decision Making Amount and/or Complexity of Data Reviewed Labs: ordered.   This patient presents to the ED for concern of cough and shortness of breath, this involves an extensive number of treatment options, and is a complaint that carries with it a high risk of complications and morbidity.  The differential diagnosis includes influenza, COVID, other viral  illness, pneumonia, sepsis, others   Co morbidities that complicate the patient evaluation  GERD, asthma, anxiety   Lab Tests:  I Ordered, and personally interpreted labs.  The pertinent results include:  ***   Imaging Studies ordered:  I ordered imaging studies including chest x-ray I independently visualized and interpreted imaging which showed no acute findings I agree with the radiologist interpretation   Cardiac Monitoring: / EKG:  The patient was maintained on a cardiac monitor.  I personally viewed and interpreted the cardiac monitored which showed an underlying rhythm of: Sinus tachycardia   Problem List / ED Course / Critical interventions / Medication management   I ordered medication including ibuprofen  for fever Reevaluation of the patient after these medicines showed that  the patient {resolved/improved/worsened:23923::"improved"} I have reviewed the patients home medicines and have made adjustments as needed   Consultations Obtained:  I requested consultation with the ***,  and discussed lab and imaging findings as well as pertinent plan - they recommend: ***   Social Determinants of Health:  ***   Test / Admission - Considered:  ***   {Document critical care time when appropriate:1} {Document review of labs and clinical decision tools ie heart score, Chads2Vasc2 etc:1}  {Document your independent review of radiology images, and any outside records:1} {Document your discussion with family members, caretakers, and with consultants:1} {Document social determinants of health affecting pt's care:1} {Document your decision making why or why not admission, treatments were needed:1} Final Clinical Impression(s) / ED Diagnoses Final diagnoses:  None    Rx / DC Orders ED Discharge Orders     None

## 2023-08-25 NOTE — ED Triage Notes (Signed)
 Pt came in for SOB, chest pain, and coughing since yesterday night. Pt stated her head is also hurting from the coughing. Pt complains of her right ear itching as well.

## 2023-08-26 LAB — TROPONIN I (HIGH SENSITIVITY): Troponin I (High Sensitivity): 2 ng/L (ref <18)

## 2023-08-26 LAB — URINALYSIS, W/ REFLEX TO CULTURE (INFECTION SUSPECTED)
Bacteria, UA: NONE SEEN
Bilirubin Urine: NEGATIVE
Glucose, UA: NEGATIVE mg/dL
Hgb urine dipstick: NEGATIVE
Ketones, ur: NEGATIVE mg/dL
Leukocytes,Ua: NEGATIVE
Nitrite: NEGATIVE
Protein, ur: NEGATIVE mg/dL
Specific Gravity, Urine: 1.01 (ref 1.005–1.030)
pH: 7 (ref 5.0–8.0)

## 2023-08-26 LAB — BASIC METABOLIC PANEL WITH GFR
Anion gap: 9 (ref 5–15)
BUN: 11 mg/dL (ref 6–20)
CO2: 23 mmol/L (ref 22–32)
Calcium: 8.1 mg/dL — ABNORMAL LOW (ref 8.9–10.3)
Chloride: 101 mmol/L (ref 98–111)
Creatinine, Ser: 0.59 mg/dL (ref 0.44–1.00)
GFR, Estimated: >60 mL/min (ref >60)
Glucose, Bld: 98 mg/dL (ref 70–99)
Potassium: 3.5 mmol/L (ref 3.5–5.1)
Sodium: 133 mmol/L — ABNORMAL LOW (ref 135–145)

## 2023-08-26 MED ORDER — BENZONATATE 100 MG PO CAPS: 100.0000 mg | ORAL_CAPSULE | Freq: Three times a day (TID) | ORAL | 0 refills | Status: AC

## 2023-08-26 NOTE — Discharge Instructions (Signed)
 Your workup tonight was reassuring.  This is likely a viral illness.  You may take over-the-counter medication such as Tylenol  or acetaminophen  for pain and fever control at home.  I have prescribed a medication called Tessalon for your cough.  Follow-up as new with your primary care provider.  If you develop any life-threatening symptoms return to the emergency department.

## 2023-08-30 LAB — CULTURE, BLOOD (ROUTINE X 2): Culture: NO GROWTH

## 2023-12-05 ENCOUNTER — Ambulatory Visit (INDEPENDENT_AMBULATORY_CARE_PROVIDER_SITE_OTHER): Admitting: Physician Assistant

## 2023-12-05 DIAGNOSIS — M25561 Pain in right knee: Secondary | ICD-10-CM

## 2023-12-05 MED ORDER — LIDOCAINE HCL 1 % IJ SOLN
5.0000 mL | INTRAMUSCULAR | Status: AC | PRN
  Administered 2023-12-05: 5 mL

## 2023-12-05 MED ORDER — METHYLPREDNISOLONE ACETATE 40 MG/ML IJ SUSP
40.0000 mg | INTRAMUSCULAR | Status: AC | PRN
  Administered 2023-12-05: 40 mg via INTRA_ARTICULAR
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
## 2023-12-06 NOTE — Addendum Note (Signed)
 Addended by: PETER FRIEZE B on: 12/06/2023 11:12 AM   Modules accepted: Orders

## 2023-12-09 ENCOUNTER — Encounter: Payer: Self-pay | Admitting: Physician Assistant

## 2023-12-12 ENCOUNTER — Ambulatory Visit
Admission: RE | Admit: 2023-12-12 | Discharge: 2023-12-12 | Disposition: A | Discharge status: Home / Self Care | Source: Ambulatory Visit | Attending: Physician Assistant | Admitting: Physician Assistant

## 2023-12-12 DIAGNOSIS — M25561 Pain in right knee: Secondary | ICD-10-CM

## 2024-01-08 ENCOUNTER — Other Ambulatory Visit: Payer: Self-pay | Admitting: Family Medicine

## 2024-01-08 DIAGNOSIS — Z1231 Encounter for screening mammogram for malignant neoplasm of breast: Secondary | ICD-10-CM

## 2024-02-07 ENCOUNTER — Ambulatory Visit

## 2024-02-07 ENCOUNTER — Ambulatory Visit
Admission: RE | Admit: 2024-02-07 | Discharge: 2024-02-07 | Disposition: A | Discharge status: Home / Self Care | Source: Ambulatory Visit | Attending: Family Medicine | Admitting: Family Medicine

## 2024-02-07 DIAGNOSIS — Z1231 Encounter for screening mammogram for malignant neoplasm of breast: Secondary | ICD-10-CM

## 2024-02-14 ENCOUNTER — Ambulatory Visit: Payer: PRIVATE HEALTH INSURANCE | Admitting: Allergy

## 2024-03-02 ENCOUNTER — Encounter: Payer: Self-pay | Admitting: Radiology

## 2024-04-08 ENCOUNTER — Other Ambulatory Visit: Payer: Self-pay

## 2024-04-08 ENCOUNTER — Emergency Department (HOSPITAL_COMMUNITY)
Admission: EM | Admit: 2024-04-08 | Discharge: 2024-04-08 | Disposition: A | Discharge status: Home / Self Care | Attending: Emergency Medicine | Admitting: Emergency Medicine

## 2024-04-08 DIAGNOSIS — Z7982 Long term (current) use of aspirin: Secondary | ICD-10-CM | POA: Diagnosis not present

## 2024-04-08 DIAGNOSIS — R220 Localized swelling, mass and lump, head: Secondary | ICD-10-CM | POA: Diagnosis present

## 2024-04-08 DIAGNOSIS — Z859 Personal history of malignant neoplasm, unspecified: Secondary | ICD-10-CM | POA: Insufficient documentation

## 2024-04-08 DIAGNOSIS — J45909 Unspecified asthma, uncomplicated: Secondary | ICD-10-CM | POA: Insufficient documentation

## 2024-04-08 DIAGNOSIS — T7840XA Allergy, unspecified, initial encounter: Secondary | ICD-10-CM | POA: Diagnosis not present

## 2024-04-08 MED ORDER — DIPHENHYDRAMINE HCL 25 MG PO CAPS
25.0000 mg | ORAL_CAPSULE | Freq: Once | ORAL | Status: DC
  Filled 2024-04-08: qty 1

## 2024-04-08 MED ORDER — EPINEPHRINE 0.3 MG/0.3ML IJ SOAJ
0.3000 mg | INTRAMUSCULAR | 0 refills | Status: AC | PRN
  Filled 2024-04-08: qty 2, 30d supply, fill #0

## 2024-04-08 MED ORDER — PREDNISONE 20 MG PO TABS
40.0000 mg | ORAL_TABLET | Freq: Once | ORAL | Status: AC
  Administered 2024-04-08: 40 mg via ORAL
  Filled 2024-04-08: qty 2

## 2024-04-08 NOTE — ED Triage Notes (Signed)
 Patient brought in by POV with c/o allergic reaction to crab. She states they cook it at her  job however she did not eat it. Now she is itching all over. States her throat feels scratchy. Patient took Zyrtec. Symptoms started 6pm. Patient alert and oriented x4, no resp distress noted at this tim.e

## 2024-04-08 NOTE — ED Provider Notes (Signed)
 [place] [facility] AT [facility] Provider Note   CSN: [id number] Arrival date & time: 04/08/24  2012     Patient presents with: Allergic Reaction   Tammy Jacobs is a 53 y.o. female.    Allergic Reaction Patient presents with potential allergic reaction.  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  Has taken some Zyrtec at home.  Began around an hour prior to arrival.  Felt tightening in her throat and tongue may have felt a little larger.  Also feels itchy.  Has had anaphylaxis in the past.    Past Medical History:  Diagnosis Date   Asthma    Cancer (HCC)    Pulmonary nodule, left     Prior to Admission medications   Medication Sig Start Date End Date Taking? Authorizing Provider  albuterol  (PROVENTIL  HFA;VENTOLIN  HFA) 108 (90 Base) MCG/ACT inhaler Inhale 2 puffs into the lungs every 4 (four) hours as needed for wheezing or shortness of breath. 09/18/17   Windle Almarie ORN, PA-C  aspirin  EC 81 MG tablet Take 162 mg by mouth once.    [provider]  benzonatate  (TESSALON ) 100 MG capsule Take 1 capsule (100 mg total) by mouth every 8 (eight) hours. 08/26/23   Logan Ubaldo NOVAK, PA-C  clotrimazole-betamethasone (LOTRISONE) cream Apply topically. 12/21/20   [provider]  cyanocobalamin 1000 MCG tablet Take 5,000 mcg by mouth daily.    [provider]  desonide (DESOWEN) 0.05 % cream Apply topically 3 (three) times daily as needed. 09/01/20   [provider]  EPINEPHrine  0.3 mg/0.3 mL IJ SOAJ injection Inject 0.3 mg into the muscle as needed for anaphylaxis. 04/08/24   Patsey Lot, MD  estradiol (ESTRACE) 0.5 MG tablet Take by mouth. 12/15/20   [provider]  hydrOXYzine (VISTARIL) 25 MG capsule Take 25-50 mg by mouth at bedtime as needed. 02/10/21   [provider]  lidocaine  (LIDODERM ) 5 % Place 1 patch onto the skin daily.  Remove & Discard patch within 12 hours or as directed by MD 01/22/23   Mannie Pac T, DO  methocarbamol  (ROBAXIN ) 500 MG tablet Take 1 tablet (500 mg total) by mouth at bedtime as needed for muscle spasms (Back Pain). 01/22/23   Mannie Pac T, DO  montelukast  (SINGULAIR ) 10 MG tablet Take by mouth. 08/31/19   [provider]  nortriptyline  (PAMELOR ) 10 MG capsule Take 3 capsules (30 mg total) by mouth at bedtime. 03/13/21   Eappen, Saramma, MD  OVER THE COUNTER MEDICATION Take 1 tablet by mouth as directed. 2 tablets in the evening and 1 tablet at bedtime    [provider]  traMADol  (ULTRAM ) 50 MG tablet Take 1 tablet (50 mg total) by mouth every 6 (six) hours as needed for moderate pain. 02/08/14   Jegede, Olugbemiga E, MD  venlafaxine (EFFEXOR) 75 MG tablet Take 1 tablet by mouth daily. 01/11/21   [provider]    Allergies: Hydrocodone , Morphine  and codeine, Naproxen sodium, Percocet [oxycodone-acetaminophen ], Shellfish allergy, Zolpidem, and Other    Review of Systems  Updated Vital Signs BP 126/88 (BP Location: Right Arm)   Pulse 94   Temp 98.3 F (36.8 C) (Oral)   Resp 12   Ht 5' (1.524 m)   Wt 75.8 kg   LMP 06/17/2018   SpO2 100%   BMI 32.61 kg/m   Physical Exam Vitals and nursing note reviewed.  Cardiovascular:     Rate and Rhythm: Regular rhythm.  Pulmonary:     Breath sounds: No wheezing or rhonchi.  Abdominal:     Tenderness: There is no abdominal tenderness.  Skin:    Capillary Refill: Capillary refill takes less than 2 seconds.     Findings: No rash.  Neurological:     Mental Status: She is alert and oriented to person, place, and time.   No posterior pharyngeal edema.  Normal voice.  (all labs ordered are listed, but only abnormal results are displayed) Labs Reviewed - No data to display  EKG: None  Radiology: No results found.   Procedures   Medications Ordered in the ED  predniSONE  (DELTASONE ) tablet 40 mg (40 mg  Oral Given 04/08/24 2054)                                    Medical Decision Making Risk Prescription drug management.   Patient potential allergic reaction.  Exposure to shellfish.  Some respiratory complaints but exam overall reassuring.  Differential diagnosis includes various levels of the allergy.  However has been stable over the hour that she was here.  Had been given some steroids.  Will refill patient's EpiPen  which do not think that she needs right now.  Discharge home and work note given.     Final diagnoses:  Allergic reaction, initial encounter    ED Discharge Orders          Ordered    EPINEPHrine  0.3 mg/0.3 mL IJ SOAJ injection  As needed        04/08/24 2128               Patsey Lot, MD 04/08/24 2132

## 2024-04-09 ENCOUNTER — Other Ambulatory Visit: Payer: Self-pay

## 2024-04-10 ENCOUNTER — Other Ambulatory Visit: Payer: Self-pay

## 2024-04-20 ENCOUNTER — Other Ambulatory Visit: Payer: Self-pay

## 2024-04-21 ENCOUNTER — Other Ambulatory Visit: Payer: Self-pay
